# Patient Record
Sex: Male | Born: 1985
Health system: Southern US, Community
[De-identification: ages and names within clinical notes are randomized; demographics above are authoritative.]

## PROBLEM LIST (undated history)

## (undated) DIAGNOSIS — E119 Type 2 diabetes mellitus without complications: Secondary | ICD-10-CM

---

## 2013-03-10 ENCOUNTER — Emergency Department (HOSPITAL_COMMUNITY)
Admission: EM | Admit: 2013-03-10 | Discharge: 2013-03-10 | Disposition: A | Discharge status: Home / Self Care | Payer: 59 | Source: Home / Self Care | Attending: Family Medicine | Admitting: Family Medicine

## 2013-03-10 ENCOUNTER — Encounter (HOSPITAL_COMMUNITY): Payer: Self-pay | Admitting: Emergency Medicine

## 2013-03-10 DIAGNOSIS — J069 Acute upper respiratory infection, unspecified: Secondary | ICD-10-CM

## 2013-03-10 HISTORY — DX: Type 2 diabetes mellitus without complications: E11.9

## 2013-03-10 MED ORDER — AZITHROMYCIN 250 MG PO TABS: ORAL_TABLET | ORAL | Status: DC

## 2013-03-10 MED ORDER — HYDROCOD POLST-CHLORPHEN POLST 10-8 MG/5ML PO LQCR: 5.0000 mL | Freq: Two times a day (BID) | ORAL | Status: DC | PRN

## 2013-03-10 NOTE — ED Notes (Signed)
Pt  Reports  He   Had  Symptoms  Of a  sorettroat  And  r  Earache      Which  Developed  Into        A  Non   Productive      Cough        For  The  Most  Part

## 2013-03-10 NOTE — ED Provider Notes (Signed)
CSN: [id number]     Arrival date & time 03/10/13  2956 History   First MD Initiated Contact with Patient 03/10/13 [phone]     Chief Complaint  Patient presents with  . Cough   (Consider location/radiation/quality/duration/timing/severity/associated sxs/prior Treatment) Patient is a 27 y.o. male presenting with cough. The history is provided by the patient.  Cough Cough characteristics:  Non-productive and dry Severity:  Mild Onset quality:  Gradual Duration:  8 days Timing:  Constant Progression:  Unchanged Chronicity:  New Smoker: no   Context: upper respiratory infection   Relieved by:  None tried Worsened by:  Nothing tried Ineffective treatments:  Cough suppressants Associated symptoms: no fever, no rhinorrhea and no wheezing     Past Medical History  Diagnosis Date  . Diabetes mellitus without complication    History reviewed. No pertinent past surgical history. No family history on file. History  Substance Use Topics  . Smoking status: Never Smoker   . Smokeless tobacco: Not on file  . Alcohol Use: No    Review of Systems  Constitutional: Negative.  Negative for fever.  HENT: Negative for congestion, postnasal drip and rhinorrhea.   Respiratory: Positive for cough. Negative for wheezing.     Allergies  Review of patient's allergies indicates no known allergies.  Home Medications   Current Outpatient Rx  Name  Route  Sig  Dispense  Refill  . azithromycin (ZITHROMAX Z-PAK) 250 MG tablet      Take as directed on pack   6 each   0   . chlorpheniramine-HYDROcodone (TUSSIONEX PENNKINETIC ER) 10-8 MG/5ML LQCR   Oral   Take 5 mLs by mouth every 12 (twelve) hours as needed for cough.   115 mL   1    BP 154/91  Pulse 106  Temp(Src) 98.3 F (36.8 C) (Oral)  Resp 20  SpO2 97% Physical Exam  Nursing note and vitals reviewed. Constitutional: He is oriented to person, place, and time. He appears well-developed and well-nourished.  HENT:  Head:  Normocephalic.  Right Ear: External ear normal.  Left Ear: External ear normal.  Mouth/Throat: Oropharynx is clear and moist.  Eyes: Conjunctivae are normal. Pupils are equal, round, and reactive to light.  Neck: Normal range of motion. Neck supple.  Cardiovascular: Normal rate, regular rhythm and normal heart sounds.   Pulmonary/Chest: Effort normal and breath sounds normal. He has no wheezes. He has no rales.  Lymphadenopathy:    He has no cervical adenopathy.  Neurological: He is alert and oriented to person, place, and time.  Skin: Skin is warm and dry.    ED Course  Procedures (including critical care time) Labs Review Labs Reviewed - No data to display Imaging Review No results found.  EKG Interpretation    Date/Time:    Ventricular Rate:    PR Interval:    QRS Duration:   QT Interval:    QTC Calculation:   R Axis:     Text Interpretation:              MDM      Linna Hoff, MD 03/10/13 1007

## 2013-05-14 ENCOUNTER — Encounter: Payer: 59 | Attending: Endocrinology | Admitting: *Deleted

## 2013-05-14 ENCOUNTER — Encounter: Payer: Self-pay | Admitting: *Deleted

## 2013-05-14 DIAGNOSIS — Z713 Dietary counseling and surveillance: Secondary | ICD-10-CM | POA: Insufficient documentation

## 2013-05-14 DIAGNOSIS — E119 Type 2 diabetes mellitus without complications: Secondary | ICD-10-CM | POA: Insufficient documentation

## 2013-05-14 DIAGNOSIS — IMO0002 Reserved for concepts with insufficient information to code with codable children: Secondary | ICD-10-CM

## 2013-05-14 DIAGNOSIS — E1065 Type 1 diabetes mellitus with hyperglycemia: Secondary | ICD-10-CM

## 2013-05-14 NOTE — Progress Notes (Signed)
Appt start time: 1000 end time:  1030.  Assessment:  Patient was seen on  05/14/13 for individual diabetes education, referred by [facility].Marland Kitchen Lives with wife and 2 children. 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 SMBG 3-4 times a day. Likes basketball and tennis. Used to exercise more often before he had kids. Works with Oretha Ellis, RPh as his diabetes educator. Feels his carb counting ability is better than average. He states his usual A1c is in the7 - 8 % range but when he started working night shift it increased to 10.3%. Since that time he has met with his MD and updated his pump settings to include a basal pattern for night shift and his A1c has already improved to 8.7%.   Current HbA1c: 10.3%, states he has seen his MD since last test and it is down to 8.7% with improved pump settings.   Preferred Learning Style:   No preference indicated   Learning Readiness:   Ready  Change in progress  MEDICATIONS: see list. Novolog in pump  DIETARY INTAKE:  24-hr recall:  B ( AM): cereal with 2% milk OR eggs, 5 bacon, 2 toast OR oatmeal, milk to drink Snk ( AM): no  L ( PM): sandwich OR salad OR leftovers Snk ( PM): occasionally graham crackers and PNB D ( PM): large serving of meat, a starch, a vegetable, diet soda Snk ( PM): occasionally more graham cracker and PNB, not usually though Beverages: milk, diet soda, water, hot tea when working nights  Usual physical activity: goes to gym, plays basket ball  Estimated energy needs: 2100 calories 235 g carbohydrates 158 g protein 58 g fat  Intervention:  Nutrition counseling provided. He appears very knowledgeable of his diabetes management opportunities, has a good exercise routine and expressed good understanding of carb counting. He is planning to add personal CGM to his diabetes management as soon as he can make the appointment with his diabetes educator, Oretha Ellis, RPh. I reviewed carb counting from a  food group standpoint to assist him in making decisions in restaurants. Also reminded him of the importance of getting his eyes examined yearly.  Teaching Method Utilized:Auditory  Handouts given during visit include:  Carb Counting handout  Barriers to learning/adherence to lifestyle change: none  Diabetes self-care support plan:   [place] Type 1 support group available  Demonstrated degree of understanding via:  Teach Back   Monitoring/Evaluation:  Dietary intake, exercise, use of insulin pump and CGM, and body weight prn.

## 2013-09-19 ENCOUNTER — Ambulatory Visit (INDEPENDENT_AMBULATORY_CARE_PROVIDER_SITE_OTHER): Payer: Self-pay | Admitting: Family Medicine

## 2013-09-19 DIAGNOSIS — E119 Type 2 diabetes mellitus without complications: Secondary | ICD-10-CM | POA: Insufficient documentation

## 2013-09-19 DIAGNOSIS — E109 Type 1 diabetes mellitus without complications: Secondary | ICD-10-CM

## 2013-09-19 NOTE — Progress Notes (Signed)
Patient presents today for annual pharmacy visit for type 1 diabetes follow-up as part of the employer-sponsored Link to Wellness program. He has been followed by Dudley MajorMelissa Sandlin as CM, but will now be transitioned to myself as CM for quarterly follow-up. Current diabetes regimen includes Humalog via Medtronic pump. Patient does not currently take ASA, ACEi, or statin as these are not yet indicated. Most recent MD follow-up was June 2015. Patient has a pending appt for 3-4 mo follow-up in Sept 2015. Levothyroxine was added at recent MD appt in June. No issues with new med. No major health changes at this time.  Diabetes Assessment: Type of Diabetes: Type 1; Year of diagnosis 2005; Sees Diabetes provider 4 or more times per year; MD managing Diabetes Dr.South; checks blood glucose 3-4 times a day;  target A1c <7.0 %; takes medications as prescribed; checks feet daily; does not take an aspirin a day; uses glucometer Freestyle Lite; Diabetes Education 05/14/13 1:1 RD at [facility]; hypoglycemia frequency rare; Highest CBG 250; Lowest CBG 140; A1c 8.4 Other Diabetes History: Current med regimen includes Humalog via pump. Patient is able to adjust his own basal rates and settings, although Dr. Evlyn KannerSouth does this some as well. He also has seen Edison Paceathey Miller, CDE in the past but does not see her regularly. Patient does maintain good medication compliance. Patient did not bring meter today but is currently testing 3-4 times per day. He uses CGM sensor at times and adjusts testing based on whether or not he is wearing sensor. Glucose monitoring occurs fasting, before supper, and before bed during off weeks. When working night shift testing occurs after waking from sleep, around midnight, and around 6 am. Also tests if/when symptomatic. Hypoglycemia frequency is rare and easily corrected. Per pt report, glucose generally runs 140-250s. He notes that glucose has become more difficult to control  after starting night shift. A1c has always been <8.0 in the past, and after starting night shift A1c increased to >10, it has now decreased to 8.4, but goal remains <7.0 and patient is hopeful it will continue to imrpove. Patient denies signs and symptoms of neuropathy including numbness/tingling/burning and symptoms of foot infection. Patient is not due for eye exam, yearly exam not indicated at this time.  Lifestyle Factors: Diet - no major changes, patient reports he could use improvement in diet, but feels diet is overall under good control. He is familiar with carb counting and does so prior to meals for accurate bolus dosing. He uses bolus wizard to calculate doses.  Exercise - Plays basketball once weekly and walks at least 3 times weekly. Pt works night shift 7 on, 7 off. He walks nearly every day on off week and usually 3 times during work week.   Assessment: Patient presents today for pharmacy consult and with improved A1c. He has been a type 1 diabetic for approximately 10 years and has had recent worsening of DM control after starting night shift. DM is improving now and patient is learning to manage better while on night shift. He does well with diabetes and exercise, but will work to maintain and further improve these lifestyle areas. He will follow-up in 3 months.  Plan: 1) Continue making healthy dietary choices 2) Continue exercising as much as possible, goal of 150 min per week 3) Continue testing regularly 4) Follow-up in 3 months on Wednesday Oct 7th @ 8:30 am (may reschedule if needed depending on work schedule)

## 2013-11-08 ENCOUNTER — Encounter: Payer: Self-pay | Admitting: Family Medicine

## 2013-11-08 NOTE — Progress Notes (Signed)
Patient ID: Lee West, male   DOB: 1985/07/07, 28 y.o.   MRN: [id number] Reviewed: Agree with the documentation and management by my [facility] Pharmacologist.

## 2013-12-19 ENCOUNTER — Ambulatory Visit (INDEPENDENT_AMBULATORY_CARE_PROVIDER_SITE_OTHER): Payer: Self-pay | Admitting: Family Medicine

## 2013-12-19 VITALS — Wt 204.0 lb

## 2013-12-19 DIAGNOSIS — E109 Type 1 diabetes mellitus without complications: Secondary | ICD-10-CM

## 2013-12-19 NOTE — Progress Notes (Signed)
Patient presents today for 3 month follow-up for type 1 diabetes as part of the employer-sponsored Link to Wellness program. Current diabetes regimen includes Humalog via Medtronic pump. Patient does not currently take ASA, ACEi, or statin as these are not yet indicated. Most recent MD follow-up was Sept 2015. Patient has a pending apt with Edison Paceathey Miller, CDE in November and with Dr. Evlyn KannerSouth, endo in Feb. No med changes or major health changes at this time. A1c at most recent MD apt was 8.7.  Diabetes Assessment: Type of Diabetes: Type 1; Year of diagnosis 2005; Sees Diabetes provider 4 or more times per year; MD managing Diabetes Dr.South; checks blood glucose 3-4 times a day; What is target A1c? 7.0 %; takes medications as prescribed; checks feet daily; does not take an aspirin a day; What are the signs and symptoms of hypoglycemia? Shaky; What are the signs of hyperglycemia? High blood glucose; uses glucometer Freestyle Lite; Diabetes Education 05/14/13 1:1 RD at [facility]; Lowest CBG 140; hypoglycemia frequency rare; 7 day CBG average 177; 14 day CBG average 187; 30 day CBG average 215; Highest CBG 397; A1c 8.7 Sept 2015 Other Diabetes History: Current med regimen includes Humalog via pump. Patient is able to adjust his own basal rates and settings, although Dr. Evlyn KannerSouth does this some as well. He also sees Kinder Morgan EnergyCathey Miller, CDE on occasion. Patient does maintain good medication compliance. Patient did bring meter today and is currently testing 1-3 times per day. He uses CGM sensor and adjusts testing based on whether or not he is wearing sensor. While wearing sensor he must test at least every 12 hours to calibrate. Glucose monitoring occurs fasting, before supper, and before bed during off weeks. When working night shift testing occurs after waking from sleep, around midnight, and around 6 am. Also tests if/when symptomatic. Hypoglycemia frequency is rare and easily corrected. A1c has  increased from 8.4 to 8.7. Patient denies signs and symptoms of neuropathy including numbness/tingling/burning and symptoms of foot infection. Patient is not due for eye exam, yearly exam not indicated at this time.  Lifestyle Factors: Diet - no major changes, diet is overall under good control. He is familiar with carb counting and does so prior to meals for accurate bolus dosing. He uses bolus wizard to calculate doses.  Exercise - Pt works night shift 7 on, 7 off. He walks nearly every day on off week and usually 3 times during work week. Joined [employer] for winter months and will attempt to exercise here at least twice weekly.  Assessment: Patient presents today for follow-up and deteriorated A1c. He has been a type 1 diabetic for approximately 10 years and has had recent worsening of DM control after starting night shift. DM is improving now and patient is learning to manage better while on night shift. He does well with diabetes and exercise, but will work to maintain and further improve these lifestyle areas. He will follow-up in 3 months.  Plan: 1) Continue making healthy dietary choices 2) Continue exercising as much as possible, goal of 150 min per week, attempt to use [employer] at least twice per week 3) Continue testing regularly 4) Follow-up in 3 months on Tuesday Jan 6th @ 8:30 am

## 2014-01-29 ENCOUNTER — Encounter: Payer: Self-pay | Admitting: Family Medicine

## 2014-01-29 NOTE — Progress Notes (Signed)
Patient ID: Lee West, male   DOB: 12-31-1985, 28 y.o.   MRN: [id number] Reviewed: Agree with the documentation and management of our [facility] Pharmacologist.

## 2014-03-20 ENCOUNTER — Ambulatory Visit (INDEPENDENT_AMBULATORY_CARE_PROVIDER_SITE_OTHER): Payer: Self-pay | Admitting: Family Medicine

## 2014-03-20 VITALS — BP 137/84 | Wt 207.0 lb

## 2014-03-20 DIAGNOSIS — E109 Type 1 diabetes mellitus without complications: Secondary | ICD-10-CM

## 2014-03-20 NOTE — Progress Notes (Signed)
Patient presents today for 3 month follow-up for type 1 diabetes as part of the employer-sponsored Link to Wellness program. Current diabetes regimen includes Humalog via Medtronic pump. Patient does not currently take ASA, ACEi, or statin as these are not yet indicated. Most recent MD follow-up was Nov 2015, with Lee West, Lee West. Patient has a pending apt with Dr. Evlyn West, endo in Feb. No med changes or major health changes at this time. POC A1c today was 9.2 (prev 8.7 in Sept).  Diabetes Assessment: Type of Diabetes: Type 1; Year of diagnosis 2005; Sees Diabetes provider 4 or more times per year; MD managing Diabetes Dr.South; checks blood glucose 3-4 times a day; What is target A1c? 7.0 %; takes medications as prescribed; checks feet daily; does not take an aspirin a day; High blood glucose; uses glucometer Freestyle Lite; hypoglycemia frequency rare; 7 day CBG average 186; 14 day CBG average 214; 30 day CBG average 213; Highest CBG 350; Lowest CBG 71 Other Diabetes History: Current med regimen includes Humalog via pump. Patient is able to adjust his own basal rates and settings, although Dr. Evlyn West does this some as well. He also sees Kinder Morgan EnergyCathey West, Lee West on occasion. He most recently saw Lee West in November and minor basal rate adjustments were made, but patient will adjust further based on A1c today. Patient does maintain good medication compliance. Patient did bring meter today and is currently testing 1-3 times per day. He uses CGM sensor and adjusts testing based on whether or not he is wearing sensor. While wearing sensor he must test at least every 12 hours to calibrate. Glucose monitoring occurs fasting, before supper, and before bed during off weeks. When working night shift testing occurs after waking from sleep, around midnight, and around 6 am. Also tests if/when symptomatic. Hypoglycemia frequency is rare and easily corrected. A1c has increased from 8.7 to 9.2. Patient denies signs and symptoms of  neuropathy including numbness/tingling/burning and symptoms of foot infection. Patient is due for yearly eye exam in 2016.  Lifestyle Factors: Diet - no major changes, diet is overall under good control. He is familiar with carb counting and does so prior to meals for accurate bolus dosing. He uses bolus wizard to calculate doses.  Exercise - Pt works night shift 7 on, 7 off. He has joined [employer] for winter months and attempts to exercise here twice weekly, but admits that during work weeks he usually only exercises once weekly.   Plan: 1) Continue making healthy dietary choices 2) Continue exercising as much as possible, goal of 150 min per week, attempt to use [employer] at least twice per week 3) Continue testing regularly 4) Follow-up in 3 months on Wednesday April 6th @ 8:30 am

## 2014-04-23 ENCOUNTER — Encounter: Payer: Self-pay | Admitting: Family Medicine

## 2014-04-23 NOTE — Progress Notes (Signed)
Patient ID: Lee West, male   DOB: 13-May-1985, 29 y.o.   MRN: [id number] Reviewed: Agree with documentation and management of our [facility] pharmacologist.

## 2014-06-19 ENCOUNTER — Ambulatory Visit (INDEPENDENT_AMBULATORY_CARE_PROVIDER_SITE_OTHER): Payer: Self-pay | Admitting: Family Medicine

## 2014-06-19 ENCOUNTER — Ambulatory Visit: Payer: Self-pay | Admitting: Pharmacist

## 2014-06-19 VITALS — BP 136/93 | Ht 71.0 in | Wt 205.0 lb

## 2014-06-19 DIAGNOSIS — E109 Type 1 diabetes mellitus without complications: Secondary | ICD-10-CM

## 2014-06-19 NOTE — Progress Notes (Signed)
Subjective:  Patient presents today for 3 month diabetes follow-up as part of the employer-sponsored Link to Wellness program.  Current diabetes regimen includes Humalog via pump.  ASA, ACE Inhibitor and statin are not yet indicated.  Most recent MD follow-up was Feb 2016.  Patient has a pending appt for 4 month follow-up.  No med changes or major health changes at this time.   Assessment/Plan:  Patient is a 29 yo male with DM type 1. Most recent A1C was 9.0 % which is exceeding goal of less than 7%. Weight is stable.  Pt is tolerating insulin regimen well, and patient adjusts basal rates as needed.  Most recently he adjusts the 11pm-7am rate during night shift hours to better control glucose during work days.  He did bring meter today and is testing once daily and as needed.  He wears CGM daily and uses this as his primary source of glucose monitoring.  Hypoglycemia occurs occasionally, and patient is aware of how to recognize symptoms and properly correct.  Patient has not had a recent eye exam but will schedule one soon.  MD has recommended an exam every two years at this time.  Patient sees a dentist every 6 months for routine checkup.    Lifestyle:  Physical Activity- Patient is walking 1 mile three times per week, sometimes more.  He has been doing this over the past month due to warmer weather.  Patient reports noticing improvements in glucose during times of more regular exercise.   Diet - Patient attempts to maintain a healthy diet and is compliant with carb counting.  He reports portion sizes being somewhat uncontrolled on occasion and wants to work on this for next visit.     Goals for Next Visit:  1)  Maintain regular exercise, at least three times per week, goal of 150 minutes per week 2)  Maintain healthy dietary choices, attempt to better manage portion control in areas in need 3)  Continue testing adequately and maintaining compliance with medications 4)  Follow-up in 3 months on  Tuesday June 28th @ 8:30 am   Lee West, PharmD Link to Wellness Diabetes Care Coordinator [facility]

## 2014-06-19 NOTE — Patient Instructions (Addendum)
1)  Maintain regular exercise, at least three times per week, goal of 150 minutes per week 2)  Maintain healthy dietary choices, attempt to better manage portion control in areas in need 3)  Continue testing adequately and maintaining compliance with medications 4)  Follow-up in 3 months on Tuesday June 28th @ 8:30 am

## 2014-06-25 NOTE — Progress Notes (Signed)
Patient ID: Lee West, male   DOB: 03/28/1985, 29 y.o.   MRN: [id number] ATTENDING PHYSICIAN NOTE: I have reviewed the chart and agree with the plan as detailed above. Denny LevySara Connelly Spruell MD Pager [phone]

## 2014-09-10 ENCOUNTER — Ambulatory Visit: Payer: 59 | Admitting: Pharmacist

## 2014-09-10 ENCOUNTER — Ambulatory Visit (INDEPENDENT_AMBULATORY_CARE_PROVIDER_SITE_OTHER): Payer: Self-pay | Admitting: Family Medicine

## 2014-09-10 VITALS — BP 124/82 | Ht 71.0 in | Wt 205.0 lb

## 2014-09-10 DIAGNOSIS — E109 Type 1 diabetes mellitus without complications: Secondary | ICD-10-CM

## 2014-09-10 NOTE — Progress Notes (Signed)
Subjective:  Patient presents today for 3 month diabetes follow-up as part of the employer-sponsored Link to Wellness program.  Current diabetes regimen includes Humalog via pump.  ASA, ACE Inhibitor and statin are not yet indicated.  Most recent MD follow-up was June 2016.  Patient has a pending appt for 4 month follow-up in Oct.  Levothyroxine dose adjusted, otherwise, no med changes or major health changes at this time.   Assessment/Plan:  Patient is a 29 yo male with DM type 1. Most recent A1C was 9.2 % (prev 9.0%) which is exceeding goal of less than 7%. Weight is stable.  Pt is tolerating insulin regimen well, and patient adjusts basal rates as needed.  Most recently he and Dr. Evlyn KannerSouth adjusted both 11pm-7am rate and the 7am-10pm rates overall increasing total daily insulin.  He did bring meter today and is testing once daily and as needed.  He wears CGM daily and uses this as his primary source of glucose monitoring.  Hypoglycemia occurs occasionally, and patient is aware of how to recognize symptoms and properly correct.  Patient has not had a recent eye exam but will schedule one within the next year as recommended by Dr. Evlyn KannerSouth.  MD has recommended an exam every two years at this time.  Patient sees a dentist every 6 months for routine checkup.    Glucose Averages  7d - 175 14d - 194 30d - 175  Lifestyle:  Physical Activity- Patient continues walking 1 mile 3-5 times per week, especially on off weeks (works night shift every other week).  He will continue to exercise regularly.  Diet - Patient attempts to maintain a healthy diet and is compliant with carb counting.  He reports portion sizes continue to be somewhat uncontrolled and wants to continue to work on this for next visit.     Goals for Next Visit:  1)  Continue testing regularly 2)  Continue exercising at least three days per week 3)  Continue to maintain a healthy diet limiting portion sizes and sweets 4)  Schedule an eye  exam within the next year 5)  Follow-up in 3 months on Tuesday Sept 20th @ 8:30 am   Orlin HildingElizabeth Joshva Labreck, PharmD Link to Wellness Diabetes Care Coordinator [facility]

## 2014-09-10 NOTE — Patient Instructions (Addendum)
1)  Continue testing regularly 2)  Continue exercising at least three days per week 3)  Continue to maintain a healthy diet limiting portion sizes and sweets 4)  Schedule an eye exam within the next year 5)  Follow-up in 3 months on Tuesday Sept 20th @ 8:30 am

## 2014-09-17 NOTE — Progress Notes (Signed)
Patient ID: Stevphen RochesterJames L Hedberg, male   DOB: 04-14-1985, 29 y.o.   MRN: [id number] ATTENDING PHYSICIAN NOTE: I have reviewed the chart and agree with the plan as detailed above. Denny LevySara Shaheem Pichon MD Pager [phone]

## 2014-12-03 ENCOUNTER — Ambulatory Visit (INDEPENDENT_AMBULATORY_CARE_PROVIDER_SITE_OTHER): Payer: Self-pay | Admitting: Family Medicine

## 2014-12-03 ENCOUNTER — Encounter: Payer: Self-pay | Admitting: Pharmacist

## 2014-12-03 ENCOUNTER — Ambulatory Visit: Payer: 59 | Admitting: Pharmacist

## 2014-12-03 VITALS — BP 114/80 | Wt 205.0 lb

## 2014-12-03 DIAGNOSIS — E109 Type 1 diabetes mellitus without complications: Secondary | ICD-10-CM

## 2014-12-03 NOTE — Progress Notes (Signed)
Subjective:  Patient presents today for 3 month diabetes follow-up as part of the employer-sponsored Link to Wellness program.  Current diabetes regimen includes Humalog via pump.  ASA, ACE Inhibitor and statin are not yet indicated.  Most recent MD follow-up was June 2016.  Patient has a pending appt for 4 month follow-up in Oct.  Levothyroxine dose adjusted, otherwise, no med changes or major health changes at this time.    Assessment/Plan:  Patient is a 29 yo male with DM type 1. Most recent A1C was 9.2 % (prev 9.0%) which is exceeding goal of less than 7%. Weight is stable.  Pt is tolerating insulin regimen well, and patient adjusts basal rates as needed.  Most recently he has started adjusting daytime off-work rates due to hyperglycemia and will begin to adjust night-time rates soon. He did bring meter today and is testing 1-2 times daily and as needed.  He wears CGM daily and uses this as his primary source of glucose monitoring.  Patient was sick several weeks ago and since this time glucose has been uncontrolled running consistently >200.  Patient did have one reading recently of 65 after making an adjustment to basal rates.  Hypoglycemia occurs occasionally, and patient is aware of how to recognize symptoms and properly correct.  Patient has not had a recent eye exam but will schedule one within the next year as recommended by Dr. Evlyn Kanner.  MD has recommended an exam every two years at this time.  Patient sees a dentist every 6 months for routine checkup.     Glucose Averages  7d - 175, now 230 14d - 194, now 241 30d - 175, now 241   Lifestyle:  Physical Activity- Patient continues exercising 2-3 times per week, especially on off weeks (works night shift every other week).  He is now exercising at the [facility] for 1 hour each time.   Diet - Patient attempts to maintain a healthy diet and is compliant with carb counting.  He has worked on better portion control recently and is limiting his  portions when working night shift.  He eats fast food approximately three times per week.    Goals for Next Visit:  1)  Continue testing regularly 2)  Continue exercising at least three days per week 3)  Continue to maintain a healthy diet and work on maintaining portion control 4)  Schedule an eye exam  5)  Follow-up in 3 months on Wednesday December 14th @ 8:30 am   Orlin Hilding, PharmD Link to Wellness Diabetes Care Coordinator [facility]

## 2014-12-03 NOTE — Patient Instructions (Signed)
1)  Continue testing regularly 2)  Continue exercising at least three days per week 3)  Continue to maintain a healthy diet and work on maintaining portion control 4)  Schedule an eye exam  5)  Follow-up in 3 months on Wednesday December 14th @ 8:30 am   Great to see you today!

## 2014-12-30 NOTE — Progress Notes (Signed)
I reviewed this chart and agree with the pharmacist's recommendation.  

## 2015-02-26 ENCOUNTER — Encounter: Payer: Self-pay | Admitting: Pharmacist

## 2015-02-26 ENCOUNTER — Ambulatory Visit (INDEPENDENT_AMBULATORY_CARE_PROVIDER_SITE_OTHER): Payer: Self-pay | Admitting: Family Medicine

## 2015-02-26 VITALS — BP 140/84 | HR 71 | Wt 213.0 lb

## 2015-02-26 DIAGNOSIS — E109 Type 1 diabetes mellitus without complications: Secondary | ICD-10-CM

## 2015-02-26 NOTE — Patient Instructions (Signed)
1)  Continue testing regularly 2)  Continue exercising at least 2-3 days per week 3)  Continue to maintain a healthy diet and work on maintaining portion control, especially during the holidays 4)  Follow-up in 6 months on Tuesday July 11th @ 8:30 am  Great to see you today!

## 2015-02-26 NOTE — Progress Notes (Signed)
Subjective:  Patient presents today for 3 month diabetes follow-up as part of the employer-sponsored Link to Wellness program.  Current diabetes regimen includes Humalog via pump.  ASA, ACE Inhibitor and statin are not yet indicated.  Most recent MD follow-up was Oct 2016.  Patient has a pending appt for follow-up in March.  No med changes or major health changes at this time.     Assessment/Plan:  Patient is a 29 yo male with DM type 1. Most recent A1C was 8.5% (prev 9.2 %) which is exceeding goal of less than 7%. Weight has increased by 8 lb since last appt.  Pt is tolerating insulin regimen well, and patient adjusts basal rates as needed.  Most recently he adjusted daytime off-work rates prior to last MD appt, no recent adjustments. He did bring meter today and is testing 1-2 times daily and as needed.  He wears CGM daily and uses this as his primary source of glucose monitoring.  Hypoglycemia occurs occasionally, and patient is aware of how to recognize symptoms and properly correct.  Patient is scheduled for an eye exam this month.  MD has recommended an exam every two years at this time.  Patient sees a dentist every 6 months for routine checkup.  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  [facility].   Diet - Patient attempts to maintain a healthy diet and is compliant with carb counting.  He will attempt to limit portion sizes during the holidays and will be diligent in making healthy choices and carb counting.      Goals for Next Visit:  1)  Continue testing regularly 2)  Continue exercising at least 2-3 days per week 3)  Continue to maintain a healthy diet and work on maintaining portion control, especially during  the holidays 4)  Follow-up in 6 months on Tuesday July 11th @ 8:30 am  Great to see you today!  Orlin HildingElizabeth Bekki Tavenner, PharmD Link to Wellness Diabetes Care Coordinator [facility]

## 2015-03-11 NOTE — Progress Notes (Signed)
I have reviewed this pharmacist's note and agree  

## 2015-03-28 MED FILL — FREESTYLE LITE TEST STRIP: 75 days supply | Qty: 300 | Fill #2

## 2015-03-28 MED FILL — NovoLOG 100 UNIT/ML SOLN: 100 | 90 days supply | Qty: 80 | Fill #0

## 2015-03-28 MED FILL — LEVOTHYROXINE 125 MCG TAB: 125 | 90 days supply | Qty: 90 | Fill #1

## 2015-05-21 DIAGNOSIS — E038 Other specified hypothyroidism: Secondary | ICD-10-CM | POA: Diagnosis not present

## 2015-05-21 DIAGNOSIS — Z6829 Body mass index (BMI) 29.0-29.9, adult: Secondary | ICD-10-CM | POA: Diagnosis not present

## 2015-05-21 DIAGNOSIS — E1065 Type 1 diabetes mellitus with hyperglycemia: Secondary | ICD-10-CM | POA: Diagnosis not present

## 2015-06-09 DIAGNOSIS — E109 Type 1 diabetes mellitus without complications: Secondary | ICD-10-CM | POA: Diagnosis not present

## 2015-07-02 MED FILL — FREESTYLE LITE TEST STRIP: 25 days supply | Qty: 100 | Fill #3

## 2015-07-02 MED FILL — NovoLOG 100 UNIT/ML SOLN: 100 | 90 days supply | Qty: 80 | Fill #1

## 2015-09-04 MED FILL — LEVOTHYROXINE 125 MCG TAB: 125 | 90 days supply | Qty: 90 | Fill #0

## 2015-09-08 DIAGNOSIS — E109 Type 1 diabetes mellitus without complications: Secondary | ICD-10-CM | POA: Diagnosis not present

## 2015-09-11 DIAGNOSIS — E109 Type 1 diabetes mellitus without complications: Secondary | ICD-10-CM | POA: Diagnosis not present

## 2015-09-23 ENCOUNTER — Ambulatory Visit: Payer: Self-pay | Admitting: Pharmacist

## 2015-10-07 DIAGNOSIS — Z6829 Body mass index (BMI) 29.0-29.9, adult: Secondary | ICD-10-CM | POA: Diagnosis not present

## 2015-10-07 DIAGNOSIS — E038 Other specified hypothyroidism: Secondary | ICD-10-CM | POA: Diagnosis not present

## 2015-10-07 DIAGNOSIS — E1065 Type 1 diabetes mellitus with hyperglycemia: Secondary | ICD-10-CM | POA: Diagnosis not present

## 2015-10-23 ENCOUNTER — Other Ambulatory Visit: Payer: Self-pay | Admitting: Pharmacist

## 2015-10-23 ENCOUNTER — Encounter: Payer: Self-pay | Admitting: Pharmacist

## 2015-10-23 VITALS — BP 116/82 | Wt 208.0 lb

## 2015-10-23 DIAGNOSIS — E109 Type 1 diabetes mellitus without complications: Secondary | ICD-10-CM

## 2015-10-23 NOTE — Patient Outreach (Signed)
[facility]) Care Management  10/23/2015  Stevphen RochesterJames L Kluth 01-19-1986 [id number]   Subjective:  Patient presents today for 3 month diabetes follow-up as part of the employer-sponsored Link to Wellness program. Current diabetes regimen includes Humalog via pump. ASA, ACE Inhibitor and statin are not yet indicated. Most recent MD follow-up was July 2017. Patient has a pending appt for follow-up in December 2017. No med changes or major health changes at this time.     Assessment/Plan:  Patient is a 30 yo male with DM type 1. Most recent A1C was 8.5% (prev 8.9 % March) which is exceeding goal of less than 7%. Weight has decreased by 6 lb since last appt.  Pt is tolerating insulin regimen well, and patient adjusts basal rates as needed.  Most recently he adjusted daytime off-work rates, specifically early AM.  Also, he is on a break from CGM at this time, but plans to resume use soon and at this time will increase overall basal rates by 5%.  He did bring meter today and is testing 2-3 times daily and as needed, more often when not wearing CGM.  He wears CGM daily for 5-6 weeks then takes a break for 1-2 weeks as it is difficult to find enough sites for continuous CGM use.  Hypoglycemia is rare at this time, and patient is aware of how to recognize symptoms and properly correct.  Patient is up to date on eye exam, last exam in December.  MD has recommended an exam every two years at this time.  Patient sees a dentist every 6 months for routine checkup.    Average insulin use per day:  70-80 units daily (will soon be increasing by 5% overall)  Home meter (fasting readings mostly) 7d - 183 14d - 184 30d - 194 Work meter  7d - 175 14d - 171 30d - 185  Lifestyle:  Physical Activity- Patient continues exercising specifically on off weeks.  Exercises more often in summer months due to warm weather.  This summer he has been biking outdoors at least 5 days per week.  He is  not using [employer] membership at this time, but will resume once weather is cold.    Diet - Patient attempts to maintain a healthy diet and is compliant with carb counting.  Of note, he has recently made an effort to reduce diet soda and is now drinking only 1-2 times per week vs daily.    Food recall: Breakfast - english muffin blueberry, PB, sometimes banana Snacks - none Lunch - sandwich makes at home Snacks - fig bars or PB crackers Supper - eat at home and out 50/50 - chicken, hamburger, pork Eat out chickfila or [place] Beverages - water, 1/2 sweet tea, juice box No sweets on regular basis   Goals for Next Visit:  1)  Continue testing regularly 2)  Continue exercising at least 3 days per week, preferrably maintain 5 days per week 3)  Continue to maintain a healthy diet and work on maintaining portion control 4)  Follow-up in 3 months on Thursday November 16 at 9:30 am  Great to see you today!

## 2015-11-07 MED FILL — NovoLOG 100 UNIT/ML SOLN: 100 | 90 days supply | Qty: 80 | Fill #2

## 2015-11-07 MED FILL — FREESTYLE LITE TEST STRIP: 75 days supply | Qty: 300 | Fill #0

## 2015-12-17 DIAGNOSIS — E109 Type 1 diabetes mellitus without complications: Secondary | ICD-10-CM | POA: Diagnosis not present

## 2016-01-02 DIAGNOSIS — E109 Type 1 diabetes mellitus without complications: Secondary | ICD-10-CM | POA: Diagnosis not present

## 2016-01-16 MED FILL — LEVOTHYROXINE 125 MCG TAB: 125 | 90 days supply | Qty: 90 | Fill #1

## 2016-01-29 ENCOUNTER — Encounter: Payer: Self-pay | Admitting: Pharmacist

## 2016-01-29 ENCOUNTER — Other Ambulatory Visit: Payer: Self-pay | Admitting: Pharmacist

## 2016-01-29 VITALS — BP 122/86 | Wt 208.0 lb

## 2016-01-29 DIAGNOSIS — E108 Type 1 diabetes mellitus with unspecified complications: Secondary | ICD-10-CM

## 2016-01-29 NOTE — Patient Outreach (Signed)
  [facility]) Care Management  01/29/2016  Lee West 1985/04/10 [id number]   Subjective:  Patient presents today for 3 month diabetes follow-up as part of the employer-sponsored Link to Wellness program. Current diabetes regimen includes Novolog via pump. ASA, ACE Inhibitor and statin are not yet indicated. Most recent MD follow-up was July 2017. Patient has a pending appt for follow-up in December 2017. No med changes or major health changes at this time.     Assessment/Plan:  Patient is a 30 yo male with DM type 1. Most recent A1C was 8.5% (prev 8.9 % March) which is exceeding goal of less than 7%. Weight has remained the same since last appt.  Pt is tolerating insulin regimen well, and patient adjusts basal rates as needed.  Most recently he adjusted daytime off-work basal rates, specifically early AM.  Also, he is using CGM at this time, and has used it often over the previous 2-3 months in preparation to see Lee West, CDE and Dr. Evlyn KannerSouth in December for pump setting adjustments.  He did bring meter today and is testing 2-3 times daily and as needed, more often when not wearing CGM.  He wears CGM daily for 5-6 weeks then takes a break for 1-2 weeks as it is difficult to find enough sites for continuous CGM use.  Hypoglycemia is rare at this time, and patient is aware of how to recognize symptoms and properly correct.  Patient is up to date on eye exam, last exam in December 2016.  MD has recommended an exam every two years at this time.  Patient sees a dentist every 6 months for routine checkup.    Home meter (fasting readings mostly) 7d - 148 14d - 189 30d - 194 Work meter  7d - 179 14d - 168 30d - 189  Lifestyle:  Physical Activity- Patient continues exercising specifically on off weeks.  Exercising less often due to cooler weather of fall season.  He continues biking outdoors at least 3 days per week (prev 5 d/wk during warmer months).  He is  not using [employer] membership at this time, but will consider resuming since he needs options for exercising indoors.    Diet - Patient attempts to maintain a healthy diet and is compliant with carb counting.  Of note, he has recently made an effort to reduce diet soda and is now drinking only 1-2 times per week vs daily.  He recognizes the need to maintain healthy diet and portion control during holidays and will work toward this.   Food recall: Breakfast - english muffin blueberry, PB, sometimes banana Snacks - none Lunch - sandwich makes at home Snacks - fig bars or PB crackers Supper - eat at home and out 50/50 - chicken, hamburger, pork Eat out chickfila or [place] Beverages - water, 1/2 sweet tea, juice box No sweets on regular basis   Goals for Next Visit:  1)  Continue testing regularly 2)  Continue exercising at least 3 days per week, preferrably resume 5 days per week 3)  Continue to maintain a healthy diet and work on maintaining portion control especially during holidays 4)  A [facility] representative will contact you to assign a new Care manager and schedule your next follow-up appointment.   Great to see you today!   Orlin HildingElizabeth Jonathen Rathman, PharmD Link to [facility] [facility]  [phone]

## 2016-02-16 DIAGNOSIS — E038 Other specified hypothyroidism: Secondary | ICD-10-CM | POA: Diagnosis not present

## 2016-02-16 DIAGNOSIS — Z6829 Body mass index (BMI) 29.0-29.9, adult: Secondary | ICD-10-CM | POA: Diagnosis not present

## 2016-02-16 DIAGNOSIS — E1065 Type 1 diabetes mellitus with hyperglycemia: Secondary | ICD-10-CM | POA: Diagnosis not present

## 2016-02-16 MED FILL — FREESTYLE LITE TEST STRIP: 25 days supply | Qty: 100 | Fill #0

## 2016-02-16 MED FILL — NovoLOG 100 UNIT/ML SOLN: 100 | 90 days supply | Qty: 80 | Fill #0

## 2016-03-26 DIAGNOSIS — E109 Type 1 diabetes mellitus without complications: Secondary | ICD-10-CM | POA: Diagnosis not present

## 2016-04-09 DIAGNOSIS — E109 Type 1 diabetes mellitus without complications: Secondary | ICD-10-CM | POA: Diagnosis not present

## 2016-05-10 MED FILL — LEVOTHYROXINE 125 MCG TABLE: 125 | 30 days supply | Qty: 30 | Fill #0

## 2016-06-01 MED FILL — FREESTYLE LITE TEST STRIP: 90 days supply | Qty: 400 | Fill #0

## 2016-06-01 MED FILL — HumaLOG 100 UNIT/ML SOLN: 100 | 88 days supply | Qty: 70 | Fill #0

## 2016-06-18 ENCOUNTER — Other Ambulatory Visit: Payer: Self-pay

## 2016-06-18 VITALS — BP 128/88 | HR 74 | Resp 16 | Ht 71.0 in | Wt 217.4 lb

## 2016-06-18 DIAGNOSIS — E109 Type 1 diabetes mellitus without complications: Secondary | ICD-10-CM

## 2016-06-18 NOTE — Patient Outreach (Signed)
[facility]) Care Management   06/18/2016  Lee West June 21, 1985 [id number]  Lee West is an 31 y.o. male.   Member seen for follow up office visit for Link to Wellness program for self management of Type 1 diabetes  Subjective: Member states that he has been doing good. States that he has been riding his exercise bike 3-4 times a week and playing basketball for 1 hr once a week.  States he thinks he has gained some muscle but has not lost any weight.  States he is to see Lee West on 07/05/16.  States he is using his sensor about 50-70% of the time.  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  States he only has lows about 1-2 times a month with readings of 60-70.   Objective:   Review of Systems  All other systems reviewed and are negative.   Physical Exam Today's Vitals   06/18/16 0856 06/18/16 0900  BP: 128/88   Pulse: 74   Resp: 16   SpO2: 98%   Weight: 217 lb 6.4 oz (98.6 kg)   Height: 1.803 m ( )   PainSc: 0-No pain 0-No pain   Encounter Medications:   Outpatient Encounter Prescriptions as of 06/18/2016  Medication Sig Note  . insulin lispro (HUMALOG) 100 UNIT/ML injection Inject into the skin See admin instructions. Use in insulin pump   . levothyroxine (SYNTHROID, LEVOTHROID) 125 MCG tablet Take 125 mcg by mouth daily before breakfast.   . insulin aspart (NOVOLOG) 100 UNIT/ML injection Inject into the skin once. 05/14/2013: Used in insulin pump   No facility-administered encounter medications on file as of 06/18/2016.     Functional Status:   In your present state of health, do you have any difficulty performing the following activities: 06/18/2016  Hearing? N  Vision? N  Difficulty concentrating or making decisions? N  Walking or climbing stairs? N  Dressing or bathing? N  Doing errands, shopping? N  Some recent data might be hidden    Fall/Depression Screening:    PHQ 2/9 Scores 06/18/2016  PHQ - 2 Score 0     Assessment:   Member seen for follow up office visit for Link to Wellness program for self management of Type 1 diabetes.  Member not meeting diabetes self-management goal of 7% or below with last reading of 8.7%.  Member reports increasing his exercise.  Member working on portion control at meals and making wise choices when eating out.  Member to see Lee West on 07/05/16.  Member over due for eye exam and up to date with dental exams.  Plan:  Plan to exercise 4 times a week 30 minutes and play basketball for 60 minutes. Plan to eat snacks with protein while at work Plan to schedule eye exam Plan to keep 07/05/16 appt with  Lee West office  Plan to see Lee West to Wellness [facility]24/[facility] at [facility]    [facility] CM Care Plan Problem One     Most Recent Value  Care Plan Problem One  Elevated blood sugars related to dx of Type I DM as evidenced by hemoglobin A1C of 8.7%  Role Documenting the Problem One  Care Management Coordinator  Care Plan for Problem One  Active  [facility] Long Term Goal (31-90 days)  Member will lower hemoglobin A1C by 1% in the next 90 days  [facility] Long Term Goal Start Date  06/18/16  Interventions for Problem One Long Term Goal  Reviewed CHO counting and portion sizes, Encouraged to eat more protein with his snacks, Discussed healthy options when eating out,Discussed upgarding to the Medtronic 670G when he is eligible for an upgrade                    Dudley Major RN, [facility] Care Management Coordinator-Link to Wellness [facility] Care Management [phone]

## 2016-06-18 NOTE — Patient Instructions (Signed)
1. Plan to exercise 4 times a week 30 minutes and play basketball for 60 minutes. 2. Plan to eat snacks with protein while at work 3. Plan to schedule eye exam 4. Plan to keep 07/05/16 appt with  [name]  5. Plan to see Link to Wellness on 09/24/16 at [facility]

## 2016-07-04 DIAGNOSIS — E109 Type 1 diabetes mellitus without complications: Secondary | ICD-10-CM | POA: Diagnosis not present

## 2016-07-05 DIAGNOSIS — Z6829 Body mass index (BMI) 29.0-29.9, adult: Secondary | ICD-10-CM | POA: Diagnosis not present

## 2016-07-05 DIAGNOSIS — Z1389 Encounter for screening for other disorder: Secondary | ICD-10-CM | POA: Diagnosis not present

## 2016-07-05 DIAGNOSIS — E038 Other specified hypothyroidism: Secondary | ICD-10-CM | POA: Diagnosis not present

## 2016-07-05 DIAGNOSIS — E1065 Type 1 diabetes mellitus with hyperglycemia: Secondary | ICD-10-CM | POA: Diagnosis not present

## 2016-07-20 DIAGNOSIS — E109 Type 1 diabetes mellitus without complications: Secondary | ICD-10-CM | POA: Diagnosis not present

## 2016-07-21 MED FILL — LEVOTHYROXINE 125 MCG TABLE: 125 | 30 days supply | Qty: 30 | Fill #1

## 2016-09-21 MED FILL — LEVOTHYROXINE 125 MCG TABLE: 125 | 30 days supply | Qty: 30 | Fill #2

## 2016-09-21 MED FILL — FREESTYLE LITE TEST STRIP: 25 days supply | Qty: 100 | Fill #0

## 2016-09-21 MED FILL — HumaLOG 100 UNIT/ML SOLN: 100 | 88 days supply | Qty: 70 | Fill #1

## 2016-09-24 ENCOUNTER — Other Ambulatory Visit: Payer: Self-pay

## 2016-09-24 VITALS — BP 130/88 | HR 72 | Resp 16 | Ht 71.0 in | Wt 213.0 lb

## 2016-09-24 DIAGNOSIS — E109 Type 1 diabetes mellitus without complications: Secondary | ICD-10-CM

## 2016-09-24 NOTE — Patient Instructions (Signed)
1. Plan to exercise 4 times a week 30 minutes, play basketball, and play tennis for 60 minutes 1-2 times a week. 2. Plan to eat snacks with protein while at work 3. Plan to keep 12/03/16 appt with  Dr. Rinaldo Cloud[place]'s office  4. Plan to see Link to Wellness on 01/03/17 at [facility]9AM

## 2016-09-24 NOTE — Patient Outreach (Signed)
[facility]) Care Management   09/24/2016  Lee West [birth year] [id number]  Lee West is an 31 y.o. male.   Member seen for follow up office visit for Link to Wellness program for self management of Type 1 diabetes  Subjective:  Member states that he saw Lee West in April and his hemoglobin A1C has gone down to 8.2%.  States he is working on his portion sizes and trying to watch what he eats when he is working at night.  States he is playing tennis at least once a week along with playing basketball once a week.  States he is still riding his exercise bike several times a week and being active with his kids since school is out.  States he only goes low about 1-2 times a month.    Objective:   ROS  Reviewed glucometer 7 day average -187 14 day average-195 30 day average-223  Physical Exam Today's Vitals   09/24/16 0847 09/24/16 0853  BP: 130/88   Pulse: 72   Resp: 16   SpO2: 97%   Weight: 213 lb (96.6 kg)   Height: 1.803 m (5\' 11" )   PainSc: 0-No pain 0-No pain   Encounter Medications:   Outpatient Encounter Prescriptions as of 09/24/2016  Medication Sig Note  . insulin lispro (HUMALOG) 100 UNIT/ML injection Inject into the skin See admin instructions. Use in insulin pump   . levothyroxine (SYNTHROID, LEVOTHROID) 125 MCG tablet Take 125 mcg by mouth daily before breakfast.   . insulin aspart (NOVOLOG) 100 UNIT/ML injection Inject into the skin once. 05/14/2013: Used in insulin pump   No facility-administered encounter medications on file as of 09/24/2016.     Functional Status:   In your present state of health, do you have any difficulty performing the following activities: 09/24/2016 06/18/2016  Hearing? N N  Vision? N N  Difficulty concentrating or making decisions? N N  Walking or climbing stairs? N N  Dressing or bathing? N N  Doing errands, shopping? - N  Some recent data might be hidden    Fall/Depression Screening:    Fall Risk  09/24/2016  06/18/2016 06/19/2014  Falls in the past year? No No No   PHQ 2/9 Scores 09/24/2016 06/18/2016  PHQ - 2 Score 0 0    Assessment:  Member seen for follow up office visit for Link to Wellness program for self management of Type 1 diabetes.  Member not meeting diabetes self-management goal of 7% or below with last reading decreased to 8.2%.  Member reports increasing his exercise.  Member working on portion control at meals and making wise choices when eating out.  Member to see Lee West on 11/02/16.  Member over due for eye exam and up to date with dental exams.  Member has eye exam scheduled for 12/03/16 Plan:   Plan to exercise 4 times a week 30 minutes, play basketball, and play tennis for 60 minutes 1-2 times a week. Plan to eat snacks with protein while at work Plan to keep 12/03/16 appt with  Lee West office  Plan to see Lee West to Wellness on 01/03/17 at [facility]  [facility] CM Care Plan Problem One     Most Recent Value  Care Plan Problem One  Elevated blood sugars related to dx of Type I DM as evidenced by hemoglobin A1C of 8.7%  Role Documenting the Problem One  Care Management Coordinator  Care Plan for Problem One  Active  [facility] Long Term Goal  Member will lower hemoglobin A1C by 1% in the next 90 days  [facility] Long Term Goal Start Date  09/24/16 [Continue last hemoglobin A1C 8.2%]  Interventions for Problem One Long Term Goal  Reviewed CHO counting and portion sizes, Reinforced to eat more protein with his snacks,, Again discussed upgarding to the Medtronic 670G when he is eligible for an upgrade, Instructed to keep eye appt and reviewed importance of regular eye exams     [facility] CM Care Plan Problem Two     Most Recent Value  Care Plan Problem Two      Care Plan for Problem Two  Not Active    Dudley MajorMelissa Taelor Waymire RN, [facility] Care Management Coordinator-Link to Wellness [facility] Care Management [phone]

## 2016-10-12 DIAGNOSIS — E109 Type 1 diabetes mellitus without complications: Secondary | ICD-10-CM | POA: Diagnosis not present

## 2016-10-27 DIAGNOSIS — E109 Type 1 diabetes mellitus without complications: Secondary | ICD-10-CM | POA: Diagnosis not present

## 2016-11-02 DIAGNOSIS — E1065 Type 1 diabetes mellitus with hyperglycemia: Secondary | ICD-10-CM | POA: Diagnosis not present

## 2016-11-02 DIAGNOSIS — E038 Other specified hypothyroidism: Secondary | ICD-10-CM | POA: Diagnosis not present

## 2016-11-02 DIAGNOSIS — Z6829 Body mass index (BMI) 29.0-29.9, adult: Secondary | ICD-10-CM | POA: Diagnosis not present

## 2016-11-02 MED FILL — AZELASTINE HCL 137 MCG SPRY: 0.1 | 90 days supply | Qty: 30 | Fill #0

## 2016-11-02 MED FILL — FREESTYLE LIBRE SENSOR SYST: 30 days supply | Qty: 3 | Fill #0

## 2016-12-03 DIAGNOSIS — E113291 Type 2 diabetes mellitus with mild nonproliferative diabetic retinopathy without macular edema, right eye: Secondary | ICD-10-CM | POA: Diagnosis not present

## 2016-12-06 MED FILL — LEVOTHYROXINE 125 MCG TABLE: 125 | 30 days supply | Qty: 30 | Fill #3

## 2016-12-06 MED FILL — FREESTYLE LIBRE SENSOR SYST: 30 days supply | Qty: 3 | Fill #1

## 2017-01-03 ENCOUNTER — Other Ambulatory Visit: Payer: Self-pay

## 2017-01-03 VITALS — BP 128/82 | HR 69 | Resp 16 | Ht 71.0 in | Wt 214.0 lb

## 2017-01-03 DIAGNOSIS — E109 Type 1 diabetes mellitus without complications: Secondary | ICD-10-CM

## 2017-01-03 NOTE — Patient Instructions (Signed)
1. Plan to exercise 4 times a week 30 minutes, play basketball, and ride exercise bike for 60 minutes 2-3 times a week. 2. Plan to continue to eat snacks with protein while at work 3. Plan to keep 03/23/17 appt with  Dr. Rinaldo Cloud[place]'s office  4. Plan to see Link to Wellness on 04/21/17 at [facility]9AM

## 2017-01-03 NOTE — Patient Outreach (Signed)
[facility]) Care Management   01/03/2017  Lee West [birth year] [id number]  Lee West is an 31 y.o. male.   Member seen for follow up office visit for Link to Wellness program for self management of Type 1 diabetes  Subjective:  Member states that he is now using the [employer] sensor.  States that his hemoglobin A1C has gone up to 9%.  States that Dr.South did not change any of his insulin pump setting and is waiting to see if wearing the sensor will make a difference.  States he has been playing basketball and playing with his children for exercise.  Reports most of his lower reading are several hours after he has exercised.  States he feels low at 65-70 range.  Objective:   ROS  Physical Exam Today's Vitals   01/03/17 0901  BP: 128/82  Pulse: 69  Resp: 16  SpO2: 96%  Weight: 214 lb (97.1 kg)  Height: 1.803 m (5\' 11" )  PainSc: 0-No pain   Encounter Medications:   Outpatient Encounter Prescriptions as of 01/03/2017  Medication Sig Note  . insulin lispro (HUMALOG) 100 UNIT/ML injection Inject into the skin See admin instructions. Use in insulin pump   . levothyroxine (SYNTHROID, LEVOTHROID) 125 MCG tablet Take 125 mcg by mouth daily before breakfast.   . insulin aspart (NOVOLOG) 100 UNIT/ML injection Inject into the skin once. 05/14/2013: Used in insulin pump   No facility-administered encounter medications on file as of 01/03/2017.     Functional Status:   In your present state of health, do you have any difficulty performing the following activities: 01/03/2017 09/24/2016  Hearing? N N  Vision? N N  Difficulty concentrating or making decisions? N N  Walking or climbing stairs? N N  Dressing or bathing? N N  Doing errands, shopping? N -  Some recent data might be hidden    Fall/Depression Screening:    Fall Risk  01/03/2017 09/24/2016 06/18/2016  Falls in the past year? No No No   PHQ 2/9 Scores 01/03/2017 09/24/2016 06/18/2016  PHQ - 2  Score 0 0 0    Assessment:  Member seen for follow up office visit for Link to Wellness program for self management of Type 1diabetes. Member not meeting diabetes self-management goal of 7% or below with last reading increased to 9%. Member now wearing the [employer] sensor.  Time in range for last month 34%, 64% over range and 1% below Member to see Dr.South on 03/23/17. Member had  eye exam on 12/03/16 and is up to date with dental exams.    Plan:   Plan to exercise 4 times a week 30 minutes, play basketball, and ride exercise bike for 60 minutes 2-3 times a week. Plan to continue to eat snacks with protein while at work Plan to keep 03/23/17 appt with  [name]  Plan to see [name] to Wellness on 04/21/17 at [facility]  [facility] CM Care Plan Problem One     Most Recent Value  Care Plan Problem One  Elevated blood sugars related to dx of Type I DM as evidenced by hemoglobin A1C of 8.7%  Role Documenting the Problem One  Care Management Coordinator  Care Plan for Problem One  Active  [facility] Long Term Goal   Member will lower hemoglobin A1C by 1% in the next 90 days  [facility] Long Term Goal Start Date  01/03/17 [Continue last hemoglobin A1C 9%]  Interventions for Problem One Long Term Goal  Reviewed CHO counting and portion sizes, Reinforced to eat more protein with his snacks,, Again discussed upgarding to the [employer] 670G when he is eligible for an upgrade, Encouraged to discuss with provider about changing his pump setting or his insulin to CHO ratio to help decrease his blood sugars    [facility] CM Care Plan Problem Two     Most Recent Value  Care Plan Problem Two      Care Plan for Problem Two  Not Active    Dudley MajorMelissa Larisa Lanius RN, [facility] CenterBSN,CCM Care Management Coordinator-Link to Wellness [facility] Care Management [phone]

## 2017-01-07 MED FILL — LEVOTHYROXINE 125 MCG TABLE: 125 | 30 days supply | Qty: 30 | Fill #4

## 2017-01-07 MED FILL — HumaLOG 100 UNIT/ML SOLN: 100 | 88 days supply | Qty: 70 | Fill #2

## 2017-01-07 MED FILL — FREESTYLE LITE TEST STRIP: 25 days supply | Qty: 100 | Fill #1

## 2017-01-07 MED FILL — FREESTYLE LIBRE SENSOR SYST: 30 days supply | Qty: 3 | Fill #2

## 2017-01-27 ENCOUNTER — Other Ambulatory Visit: Payer: Self-pay

## 2017-01-27 NOTE — Patient Outreach (Signed)
[facility]) Care Management  01/27/2017  Lee RochesterJames L West January 10, 1986 [id number]   Telephone call to inform of closing of Link to Wellness case and transition to Active [employer].  Instructed that he will be transitioned to Active Health Management in 2019 for disease management and the Link to Wellness program will be closed. Instructed that he will be contacted by [employer] by phone in January 2019.   Instructed that he will continue to receive the pharmacy benefit.    [employer] will contact member in January to continue diabetes disease management. Case closed for Link to Wellness as member will be enrolled in an external program. Member and provider to be sent letter on transition to Active Health Management  Dudley MajorMelissa Maeci Kalbfleisch RN, [facility] CenterBSN,CCM Care Management Coordinator-Link to Wellness [facility] Care Management [phone]

## 2017-01-31 DIAGNOSIS — E109 Type 1 diabetes mellitus without complications: Secondary | ICD-10-CM | POA: Diagnosis not present

## 2017-02-07 DIAGNOSIS — E109 Type 1 diabetes mellitus without complications: Secondary | ICD-10-CM | POA: Diagnosis not present

## 2017-02-14 MED FILL — FREESTYLE LIBRE SENSOR SYST: 30 days supply | Qty: 3 | Fill #3

## 2017-03-22 MED FILL — LEVOTHYROXINE 125 MCG TAB: 125 | 30 days supply | Qty: 30 | Fill #0

## 2017-03-23 DIAGNOSIS — Z6829 Body mass index (BMI) 29.0-29.9, adult: Secondary | ICD-10-CM | POA: Diagnosis not present

## 2017-03-23 DIAGNOSIS — E1065 Type 1 diabetes mellitus with hyperglycemia: Secondary | ICD-10-CM | POA: Diagnosis not present

## 2017-03-23 DIAGNOSIS — Z1389 Encounter for screening for other disorder: Secondary | ICD-10-CM | POA: Diagnosis not present

## 2017-03-23 DIAGNOSIS — E038 Other specified hypothyroidism: Secondary | ICD-10-CM | POA: Diagnosis not present

## 2017-03-31 MED FILL — FREESTYLE LIBRE SENSOR SYST: 30 days supply | Qty: 3 | Fill #4

## 2017-04-13 MED FILL — FREESTYLE LITE TEST STRIP: 25 days supply | Qty: 100 | Fill #2

## 2017-04-21 ENCOUNTER — Ambulatory Visit: Payer: 59

## 2017-04-22 MED FILL — HumaLOG 100 UNIT/ML SOLN: 100 | 87 days supply | Qty: 70 | Fill #0

## 2017-05-02 MED FILL — LEVOTHYROXINE 125 MCG TAB: 125 | 30 days supply | Qty: 30 | Fill #1

## 2017-05-02 MED FILL — FREESTYLE LIBRE SENSOR SYST: 30 days supply | Qty: 3 | Fill #5

## 2017-05-11 DIAGNOSIS — E109 Type 1 diabetes mellitus without complications: Secondary | ICD-10-CM | POA: Diagnosis not present

## 2017-05-12 DIAGNOSIS — E109 Type 1 diabetes mellitus without complications: Secondary | ICD-10-CM | POA: Diagnosis not present

## 2017-05-27 DIAGNOSIS — E1065 Type 1 diabetes mellitus with hyperglycemia: Secondary | ICD-10-CM | POA: Diagnosis not present

## 2017-05-27 DIAGNOSIS — Z4681 Encounter for fitting and adjustment of insulin pump: Secondary | ICD-10-CM | POA: Diagnosis not present

## 2017-05-27 DIAGNOSIS — Z6829 Body mass index (BMI) 29.0-29.9, adult: Secondary | ICD-10-CM | POA: Diagnosis not present

## 2017-06-17 ENCOUNTER — Other Ambulatory Visit (HOSPITAL_COMMUNITY): Payer: Self-pay | Admitting: Endocrinology

## 2017-06-17 DIAGNOSIS — R202 Paresthesia of skin: Secondary | ICD-10-CM | POA: Diagnosis not present

## 2017-06-17 DIAGNOSIS — E1065 Type 1 diabetes mellitus with hyperglycemia: Secondary | ICD-10-CM | POA: Diagnosis not present

## 2017-06-17 DIAGNOSIS — E038 Other specified hypothyroidism: Secondary | ICD-10-CM | POA: Diagnosis not present

## 2017-06-17 DIAGNOSIS — Z1389 Encounter for screening for other disorder: Secondary | ICD-10-CM | POA: Diagnosis not present

## 2017-06-17 DIAGNOSIS — Z6829 Body mass index (BMI) 29.0-29.9, adult: Secondary | ICD-10-CM | POA: Diagnosis not present

## 2017-06-17 DIAGNOSIS — R51 Headache: Secondary | ICD-10-CM | POA: Diagnosis not present

## 2017-06-17 DIAGNOSIS — E109 Type 1 diabetes mellitus without complications: Secondary | ICD-10-CM | POA: Diagnosis not present

## 2017-06-17 DIAGNOSIS — R519 Headache, unspecified: Secondary | ICD-10-CM

## 2017-06-17 MED FILL — FREESTYLE LITE TEST STRIP: 25 days supply | Qty: 100 | Fill #3

## 2017-06-17 MED FILL — LEVOTHYROXINE 125 MCG TAB: 125 | 30 days supply | Qty: 30 | Fill #2

## 2017-06-18 ENCOUNTER — Ambulatory Visit (HOSPITAL_COMMUNITY)
Admission: RE | Admit: 2017-06-18 | Discharge: 2017-06-18 | Disposition: A | Discharge status: Home / Self Care | Payer: 59 | Source: Ambulatory Visit | Attending: Endocrinology | Admitting: Endocrinology

## 2017-06-18 DIAGNOSIS — R51 Headache: Secondary | ICD-10-CM | POA: Insufficient documentation

## 2017-06-18 DIAGNOSIS — R609 Edema, unspecified: Secondary | ICD-10-CM | POA: Diagnosis not present

## 2017-06-18 DIAGNOSIS — R519 Headache, unspecified: Secondary | ICD-10-CM

## 2017-06-18 DIAGNOSIS — R202 Paresthesia of skin: Secondary | ICD-10-CM | POA: Diagnosis not present

## 2017-06-18 DIAGNOSIS — J328 Other chronic sinusitis: Secondary | ICD-10-CM | POA: Diagnosis not present

## 2017-06-18 MED ORDER — GADOBENATE DIMEGLUMINE 529 MG/ML IV SOLN
20.0000 mL | Freq: Once | INTRAVENOUS | Status: AC | PRN
  Administered 2017-06-18: 20 mL via INTRAVENOUS

## 2017-07-25 MED FILL — FREESTYLE LITE TEST STRIP: 25 days supply | Qty: 100 | Fill #4

## 2017-07-25 MED FILL — HumaLOG 100 UNIT/ML SOLN: 100 | 87 days supply | Qty: 70 | Fill #1

## 2017-07-25 MED FILL — LEVOTHYROXINE 137 MCG TABLE: 137 | 90 days supply | Qty: 90 | Fill #0

## 2017-07-27 DIAGNOSIS — Z6829 Body mass index (BMI) 29.0-29.9, adult: Secondary | ICD-10-CM | POA: Diagnosis not present

## 2017-07-27 DIAGNOSIS — E1065 Type 1 diabetes mellitus with hyperglycemia: Secondary | ICD-10-CM | POA: Diagnosis not present

## 2017-07-27 DIAGNOSIS — E038 Other specified hypothyroidism: Secondary | ICD-10-CM | POA: Diagnosis not present

## 2017-07-27 DIAGNOSIS — E11319 Type 2 diabetes mellitus with unspecified diabetic retinopathy without macular edema: Secondary | ICD-10-CM | POA: Diagnosis not present

## 2017-07-27 DIAGNOSIS — E7849 Other hyperlipidemia: Secondary | ICD-10-CM | POA: Diagnosis not present

## 2017-07-27 DIAGNOSIS — Z1389 Encounter for screening for other disorder: Secondary | ICD-10-CM | POA: Diagnosis not present

## 2017-08-01 DIAGNOSIS — E109 Type 1 diabetes mellitus without complications: Secondary | ICD-10-CM | POA: Diagnosis not present

## 2017-08-15 DIAGNOSIS — E109 Type 1 diabetes mellitus without complications: Secondary | ICD-10-CM | POA: Diagnosis not present

## 2017-10-24 MED FILL — FREESTYLE LITE TEST STRIP: 75 days supply | Qty: 300 | Fill #0

## 2017-10-24 MED FILL — LEVOTHYROXINE 137 MCG TABLE: 137 | 90 days supply | Qty: 90 | Fill #1

## 2017-10-24 MED FILL — HumaLOG 100 UNIT/ML SOLN: 100 | 87 days supply | Qty: 70 | Fill #2

## 2017-11-09 DIAGNOSIS — E109 Type 1 diabetes mellitus without complications: Secondary | ICD-10-CM | POA: Diagnosis not present

## 2017-11-11 DIAGNOSIS — E109 Type 1 diabetes mellitus without complications: Secondary | ICD-10-CM | POA: Diagnosis not present

## 2017-12-02 DIAGNOSIS — E113292 Type 2 diabetes mellitus with mild nonproliferative diabetic retinopathy without macular edema, left eye: Secondary | ICD-10-CM | POA: Diagnosis not present

## 2018-01-10 DIAGNOSIS — E109 Type 1 diabetes mellitus without complications: Secondary | ICD-10-CM | POA: Diagnosis not present

## 2018-01-10 DIAGNOSIS — E038 Other specified hypothyroidism: Secondary | ICD-10-CM | POA: Diagnosis not present

## 2018-01-10 DIAGNOSIS — R51 Headache: Secondary | ICD-10-CM | POA: Diagnosis not present

## 2018-01-10 DIAGNOSIS — Z6831 Body mass index (BMI) 31.0-31.9, adult: Secondary | ICD-10-CM | POA: Diagnosis not present

## 2018-01-10 DIAGNOSIS — E7849 Other hyperlipidemia: Secondary | ICD-10-CM | POA: Diagnosis not present

## 2018-01-18 ENCOUNTER — Ambulatory Visit (INDEPENDENT_AMBULATORY_CARE_PROVIDER_SITE_OTHER): Payer: Self-pay | Admitting: Nurse Practitioner

## 2018-01-18 ENCOUNTER — Encounter: Payer: Self-pay | Admitting: Nurse Practitioner

## 2018-01-18 VITALS — BP 121/82 | HR 102 | Temp 98.9°F | Wt 223.0 lb

## 2018-01-18 DIAGNOSIS — H109 Unspecified conjunctivitis: Secondary | ICD-10-CM

## 2018-01-18 DIAGNOSIS — R05 Cough: Secondary | ICD-10-CM

## 2018-01-18 DIAGNOSIS — B9789 Other viral agents as the cause of diseases classified elsewhere: Secondary | ICD-10-CM

## 2018-01-18 DIAGNOSIS — B9689 Other specified bacterial agents as the cause of diseases classified elsewhere: Secondary | ICD-10-CM

## 2018-01-18 DIAGNOSIS — R059 Cough, unspecified: Secondary | ICD-10-CM

## 2018-01-18 DIAGNOSIS — J329 Chronic sinusitis, unspecified: Secondary | ICD-10-CM

## 2018-01-18 MED ORDER — POLYMYXIN B-TRIMETHOPRIM 10000-0.1 UNIT/ML-% OP SOLN: 2.0000 [drp] | OPHTHALMIC | 0 refills | Status: AC

## 2018-01-18 MED ORDER — PSEUDOEPH-BROMPHEN-DM 30-2-10 MG/5ML PO SYRP: 5.0000 mL | ORAL_SOLUTION | Freq: Four times a day (QID) | ORAL | 0 refills | Status: AC | PRN

## 2018-01-18 MED FILL — BROMPHENIR-PSEUDOEPHED-DM S: 30-2-10 | 7 days supply | Qty: 150 | Fill #0

## 2018-01-18 MED FILL — POLYMYXIN B/TMP EYE DROPS: 10000-0.1 | 17 days supply | Qty: 10 | Fill #0

## 2018-01-18 NOTE — Patient Instructions (Addendum)
Bacterial Conjunctivitis -Take medications as prescribed. -Continue ibuprofen 800 mg every 8 hours for the next 3 days.  May stop sooner if symptoms improve. -Cool compresses to both eyes for comfort. -Strict hand hygiene until symptoms improve. -Use humidifier or vaporizer when at home and during sleep to help with cough. -Sleep elevated on at least 2 pillows at bedtime to help with cough. -Use a teaspoon of honey as needed for sore throat and cough. -Increase fluids. -Follow-up in our office in the next 3 to 5 days if symptoms do not improve.  Bacterial conjunctivitis is an infection of the clear membrane that covers the white part of your eye and the inner surface of your eyelid (conjunctiva). When the blood vessels in your conjunctiva become inflamed, your eye becomes red or pink, and it will probably feel itchy. Bacterial conjunctivitis spreads very easily from person to person (is contagious). It also spreads easily from one eye to the other eye. What are the causes? This condition is caused by several common bacteria. You may get the infection if you come into close contact with another person who is infected. You may also come into contact with items that are contaminated with the bacteria, such as a face towel, contact lens solution, or eye makeup. What increases the risk? This condition is more likely to develop in people who:  Are exposed to other people who have the infection.  Wear contact lenses.  Have a sinus infection.  Have had a recent eye injury or surgery.  Have a weak body defense system (immune system).  Have a medical condition that causes dry eyes.  What are the signs or symptoms? Symptoms of this condition include:  Eye redness.  Tearing or watery eyes.  Itchy eyes.  Burning feeling in your eyes.  Thick, yellowish discharge from an eye. This may turn into a crust on the eyelid overnight and cause your eyelids to stick together.  Swollen  eyelids.  Blurred vision.  How is this diagnosed? Your health care provider can diagnose this condition based on your symptoms and medical history. Your health care provider may also take a sample of discharge from your eye to find the cause of your infection. This is rarely done. How is this treated? Treatment for this condition includes:  Antibiotic eye drops or ointment to clear the infection more quickly and prevent the spread of infection to others.  Oral antibiotic medicines to treat infections that do not respond to drops or ointments, or last longer than 10 days.  Cool, wet cloths (cool compresses) placed on the eyes.  Artificial tears applied 2-6 times a day.  Follow these instructions at home: Medicines  Take or apply your antibiotic medicine as told by your health care provider. Do not stop taking or applying the antibiotic even if you start to feel better.  Take or apply over-the-counter and prescription medicines only as told by your health care provider.  Be very careful to avoid touching the edge of your eyelid with the eye drop bottle or the ointment tube when you apply medicines to the affected eye. This will keep you from spreading the infection to your other eye or to other people. Managing discomfort  Gently wipe away any drainage from your eye with a warm, wet washcloth or a cotton ball.  Apply a cool, clean washcloth to your eye for 10-20 minutes, 3-4 times a day. General instructions  Do not wear contact lenses until the inflammation is gone and your health  care provider says it is safe to wear them again. Ask your health care provider how to sterilize or replace your contact lenses before you use them again. Wear glasses until you can resume wearing contacts.  Avoid wearing eye makeup until the inflammation is gone. Throw away any old eye cosmetics that may be contaminated.  Change or wash your pillowcase every day.  Do not share towels or washcloths.  This may spread the infection.  Wash your hands often with soap and water. Use paper towels to dry your hands.  Avoid touching or rubbing your eyes.  Do not drive or use heavy machinery if your vision is blurred. Contact a health care provider if:  You have a fever.  Your symptoms do not get better after 10 days. Get help right away if:  You have a fever and your symptoms suddenly get worse.  You have severe pain when you move your eye.  You have facial pain, redness, or swelling.  You have sudden loss of vision. This information is not intended to replace advice given to you by your health care provider. Make sure you discuss any questions you have with your health care provider. Document Released: 03/01/2005 Document Revised: 07/10/2015 Document Reviewed: 12/12/2014 Elsevier Interactive Patient Education  2017 Elsevier Inc.  Cough, Adult Coughing is a reflex that clears your throat and your airways. Coughing helps to heal and protect your lungs. It is normal to cough occasionally, but a cough that happens with other symptoms or lasts a long time may be a sign of a condition that needs treatment. A cough may last only 2-3 weeks (acute), or it may last longer than 8 weeks (chronic). What are the causes? Coughing is commonly caused by:  Breathing in substances that irritate your lungs.  A viral or bacterial respiratory infection.  Allergies.  Asthma.  Postnasal drip.  Smoking.  Acid backing up from the stomach into the esophagus (gastroesophageal reflux).  Certain medicines.  Chronic lung problems, including COPD (or rarely, lung cancer).  Other medical conditions such as heart failure.  Follow these instructions at home: Pay attention to any changes in your symptoms. Take these actions to help with your discomfort:  Take medicines only as told by your health care provider. ? If you were prescribed an antibiotic medicine, take it as told by your health care  provider. Do not stop taking the antibiotic even if you start to feel better. ? Talk with your health care provider before you take a cough suppressant medicine.  Drink enough fluid to keep your urine clear or pale yellow.  If the air is dry, use a cold steam vaporizer or humidifier in your bedroom or your home to help loosen secretions.  Avoid anything that causes you to cough at work or at home.  If your cough is worse at night, try sleeping in a semi-upright position.  Avoid cigarette smoke. If you smoke, quit smoking. If you need help quitting, ask your health care provider.  Avoid caffeine.  Avoid alcohol.  Rest as needed.  Contact a health care provider if:  You have new symptoms.  You cough up pus.  Your cough does not get better after 2-3 weeks, or your cough gets worse.  You cannot control your cough with suppressant medicines and you are losing sleep.  You develop pain that is getting worse or pain that is not controlled with pain medicines.  You have a fever.  You have unexplained weight loss.  You  have night sweats. Get help right away if:  You cough up blood.  You have difficulty breathing.  Your heartbeat is very fast. This information is not intended to replace advice given to you by your health care provider. Make sure you discuss any questions you have with your health care provider. Document Released: 08/28/2010 Document Revised: 08/07/2015 Document Reviewed: 05/08/2014 Elsevier Interactive Patient Education  2018 [employer].  Sinusitis, Adult Sinusitis is soreness and inflammation of your sinuses. Sinuses are hollow spaces in the bones around your face. Your sinuses are located:  Around your eyes.  In the middle of your forehead.  Behind your nose.  In your cheekbones.  Your sinuses and nasal passages are lined with a stringy fluid (mucus). Mucus normally drains out of your sinuses. When your nasal tissues become inflamed or swollen, the  mucus can become trapped or blocked so air cannot flow through your sinuses. This allows bacteria, viruses, and funguses to grow, which leads to infection. Sinusitis can develop quickly and last for 7?10 days (acute) or for more than 12 weeks (chronic). Sinusitis often develops after a cold. What are the causes? This condition is caused by anything that creates swelling in the sinuses or stops mucus from draining, including:  Allergies.  Asthma.  Bacterial or viral infection.  Abnormally shaped bones between the nasal passages.  Nasal growths that contain mucus (nasal polyps).  Narrow sinus openings.  Pollutants, such as chemicals or irritants in the air.  A foreign object stuck in the nose.  A fungal infection. This is rare.  What increases the risk? The following factors may make you more likely to develop this condition:  Having allergies or asthma.  Having had a recent cold or respiratory tract infection.  Having structural deformities or blockages in your nose or sinuses.  Having a weak immune system.  Doing a lot of swimming or diving.  Overusing nasal sprays.  Smoking.  What are the signs or symptoms? The main symptoms of this condition are pain and a feeling of pressure around the affected sinuses. Other symptoms include:  Upper toothache.  Earache.  Headache.  Bad breath.  Decreased sense of smell and taste.  A cough that may get worse at night.  Fatigue.  Fever.  Thick drainage from your nose. The drainage is often green and it may contain pus (purulent).  Stuffy nose or congestion.  Postnasal drip. This is when extra mucus collects in the throat or back of the nose.  Swelling and warmth over the affected sinuses.  Sore throat.  Sensitivity to light.  How is this diagnosed? This condition is diagnosed based on symptoms, a medical history, and a physical exam. To find out if your condition is acute or chronic, your health care provider  may:  Look in your nose for signs of nasal polyps.  Tap over the affected sinus to check for signs of infection.  View the inside of your sinuses using an imaging device that has a light attached (endoscope).  If your health care provider suspects that you have chronic sinusitis, you may also:  Be tested for allergies.  Have a sample of mucus taken from your nose (nasal culture) and checked for bacteria.  Have a mucus sample examined to see if your sinusitis is related to an allergy.  If your sinusitis does not respond to treatment and it lasts longer than 8 weeks, you may have an MRI or CT scan to check your sinuses. These scans also help  to determine how severe your infection is. In rare cases, a bone biopsy may be done to rule out more serious types of fungal sinus disease. How is this treated? Treatment for sinusitis depends on the cause and whether your condition is chronic or acute. If a virus is causing your sinusitis, your symptoms will go away on their own within 10 days. You may be given medicines to relieve your symptoms, including:  Topical nasal decongestants. They shrink swollen nasal passages and let mucus drain from your sinuses.  Antihistamines. These drugs block inflammation that is triggered by allergies. This can help to ease swelling in your nose and sinuses.  Topical nasal corticosteroids. These are nasal sprays that ease inflammation and swelling in your nose and sinuses.  Nasal saline washes. These rinses can help to get rid of thick mucus in your nose.  If your condition is caused by bacteria, you will be given an antibiotic medicine. If your condition is caused by a fungus, you will be given an antifungal medicine. Surgery may be needed to correct underlying conditions, such as narrow nasal passages. Surgery may also be needed to remove polyps. Follow these instructions at home: Medicines  Take, use, or apply over-the-counter and prescription medicines only  as told by your health care provider. These may include nasal sprays.  If you were prescribed an antibiotic medicine, take it as told by your health care provider. Do not stop taking the antibiotic even if you start to feel better. Hydrate and Humidify  Drink enough water to keep your urine clear or pale yellow. Staying hydrated will help to thin your mucus.  Use a cool mist humidifier to keep the humidity level in your home above 50%.  Inhale steam for 10-15 minutes, 3-4 times a day or as told by your health care provider. You can do this in the bathroom while a hot shower is running.  Limit your exposure to cool or dry air. Rest  Rest as much as possible.  Sleep with your head raised (elevated).  Make sure to get enough sleep each night. General instructions  Apply a warm, moist washcloth to your face 3-4 times a day or as told by your health care provider. This will help with discomfort.  Wash your hands often with soap and water to reduce your exposure to viruses and other germs. If soap and water are not available, use hand sanitizer.  Do not smoke. Avoid being around people who are smoking (secondhand smoke).  Keep all follow-up visits as told by your health care provider. This is important. Contact a health care provider if:  You have a fever.  Your symptoms get worse.  Your symptoms do not improve within 10 days. Get help right away if:  You have a severe headache.  You have persistent vomiting.  You have pain or swelling around your face or eyes.  You have vision problems.  You develop confusion.  Your neck is stiff.  You have trouble breathing. This information is not intended to replace advice given to you by your health care provider. Make sure you discuss any questions you have with your health care provider. Document Released: 03/01/2005 Document Revised: 10/26/2015 Document Reviewed: 12/25/2014 Elsevier Interactive Patient Education  [employer].

## 2018-01-18 NOTE — Progress Notes (Addendum)
Subjective:    Patient ID: Lee West, male    DOB: 22-Aug-1985, 32 y.o.   MRN: [id number]  The patient is a 32 year old male who presents today for complaints of bilateral eye drainage and sore throat.  The patient states his eyes started draining about 2 days ago.  Patient states it was more noticeable last night while he was at work.  The drainage has been consistent or persistent throughout the day.  Patient does complain of eye itching, mild eye pain that he rates 2/10 at present, mucus appearing drainage, and mild swelling.  The patient denies any difficulty seeing or change in his vision.  The patient further denies fever, chills, ear ache or drainage.  The patient does have a history of type 1 diabetes.  Patient currently wears an insulin pump.  Conjunctivitis   The current episode started 2 days ago. The onset was sudden. The problem has been gradually worsening. The problem is moderate. Nothing aggravates the symptoms. Associated symptoms include eye itching, sore throat, cough, URI, eye discharge, eye pain and eye redness. Pertinent negatives include no fever. The eye pain is moderate. Both eyes are affected.The eye pain is not associated with movement. The eyelid exhibits swelling and redness. He has been experiencing a mild sore throat. Neither side is more painful than the other. The sore throat is characterized by pain only. He has been eating and drinking normally.  Sore Throat   Associated symptoms include coughing. Pertinent negatives include no trouble swallowing.   I reviewed the patient's past medical history, current medications, and allergies.   Review of Systems  Constitutional: Negative for fever.  HENT: Positive for sinus pressure and sore throat. Negative for trouble swallowing.   Eyes: Positive for pain, discharge, redness and itching.  Respiratory: Positive for cough.   Cardiovascular: Negative.   Gastrointestinal: Negative.   Skin: Negative.    Allergic/Immunologic: Negative.        Objective:   Physical Exam  Constitutional: He is oriented to person, place, and time. He appears well-developed and well-nourished. No distress.  HENT:  Head: Normocephalic.  Right Ear: External ear normal.  Left Ear: External ear normal.  Nose: Nose normal.  Mouth/Throat: Oropharynx is clear and moist.  Eyes: Right eye exhibits discharge. Left eye exhibits discharge. No scleral icterus.  Conjunctiva injected bilaterally, +  Mucus appearing drainage, dried  Neck: Normal range of motion. Neck supple. No tracheal deviation present. No thyromegaly present.  Cardiovascular: Normal rate, regular rhythm and normal heart sounds.  Pulmonary/Chest: Effort normal and breath sounds normal.  Abdominal: Soft. Bowel sounds are normal.  Lymphadenopathy:    He has no cervical adenopathy.  Neurological: He is alert and oriented to person, place, and time.  Skin: Skin is warm.  Blood pressure 121/82, pulse (!) 102, temperature 98.9 F (37.2 C), temperature source Oral, weight 223 lb (101.2 kg), SpO2 100 %.     Assessment & Plan:  Exam findings, diagnosis etiology and medication use and indications reviewed with patient. Follow- Up and discharge instructions provided. No emergent/urgent issues found on exam. Patient education was provided. Patient verbalized understanding of information provided and agrees with plan of care (POC), all questions answered. The patient is advised to call or return to clinic if condition does not see an improvement in symptoms, or to seek the care of the closest emergency department if condition worsens with the above plan.   1. Bacterial conjunctivitis of both eyes  - trimethoprim-polymyxin b (POLYTRIM) ophthalmic solution;  Place 2 drops into both eyes every 4 (four) hours for 10 days.  Dispense: 10 mL; Refill: 0 -Take medications as prescribed. -Continue ibuprofen 800 mg every 8 hours for the next 3 days.  May stop sooner if  symptoms improve. -Cool compresses to both eyes for comfort. -Strict hand hygiene until symptoms improve. -Follow-up in our office in the next 3 to 5 days if symptoms do not improve.   2. Viral sinusitis/Cough  -Fluticasone steroid nasal spray 2 sprays in each nostril daily for 10 days.  Patient informed he has this medication at home, do not need to send a prescription. - brompheniramine-pseudoephedrine-DM 30-2-10 MG/5ML syrup; Take 5 mLs by mouth 4 (four) times daily as needed for up to 7 days.  Dispense: 150 mL; Refill: 0 -Follow-up in our office in the next 3 to 5 days if symptoms do not improve. -Take medications as prescribed. -Continue ibuprofen 800 mg every 8 hours for the next 3 days.  May stop sooner if symptoms improve. -Use humidifier or vaporizer when at home and during sleep to help with cough. -Sleep elevated on at least 2 pillows at bedtime to help with cough. -Use a teaspoon of honey as needed for sore throat and cough. -Increase fluids. -Follow-up in our office in the next 3 to 5 days if symptoms do not improve.

## 2018-01-20 ENCOUNTER — Ambulatory Visit (INDEPENDENT_AMBULATORY_CARE_PROVIDER_SITE_OTHER): Payer: Self-pay | Admitting: Nurse Practitioner

## 2018-01-20 VITALS — BP 124/86 | HR 107 | Temp 99.5°F | Resp 20 | Wt 223.2 lb

## 2018-01-20 DIAGNOSIS — H6983 Other specified disorders of Eustachian tube, bilateral: Secondary | ICD-10-CM

## 2018-01-20 MED ORDER — HYDROCORTISONE-ACETIC ACID 1-2 % OT SOLN: 3.0000 [drp] | Freq: Two times a day (BID) | OTIC | 0 refills | Status: AC

## 2018-01-20 MED ORDER — AMOXICILLIN 875 MG PO TABS: 875.0000 mg | ORAL_TABLET | Freq: Two times a day (BID) | ORAL | 0 refills | Status: AC

## 2018-01-20 MED FILL — AMOXICILLIN 875 MG TABLET: 875 | 10 days supply | Qty: 20 | Fill #0

## 2018-01-20 NOTE — Patient Instructions (Signed)
Eustachian Tube Dysfunction -Take medication as prescribed. -Tylenol extra strength 500 mg 1 to 2 tablets every 6-8 hours as needed for pain or discomfort. -Benadryl 25 mg at bedtime until symptoms improve. -Warm compresses to the ear for discomfort. -Follow-up with your PCP if you develop loss of hearing change of hearing or other concerns. -Start Amoxicillin if symptoms do not improve.  The eustachian tube connects the middle ear to the back of the nose. It regulates air pressure in the middle ear by allowing air to move between the ear and nose. It also helps to drain fluid from the middle ear space. When the eustachian tube does not function properly, air pressure, fluid, or both can build up in the middle ear. Eustachian tube dysfunction can affect one or both ears. What are the causes? This condition happens when the eustachian tube becomes blocked or cannot open normally. This may result from:  Ear infections.  Colds and other upper respiratory infections.  Allergies.  Irritation, such as from cigarette smoke or acid from the stomach coming up into the esophagus (gastroesophageal reflux).  Sudden changes in air pressure, such as from descending in an airplane.  Abnormal growths in the nose or throat, such as nasal polyps, tumors, or enlarged tissue at the back of the throat (adenoids).  What increases the risk? This condition may be more likely to develop in people who smoke and people who are overweight. Eustachian tube dysfunction may also be more likely to develop in children, especially children who have:  Certain birth defects of the mouth, such as cleft palate.  Large tonsils and adenoids.  What are the signs or symptoms? Symptoms of this condition may include:  A feeling of fullness in the ear.  Ear pain.  Clicking or popping noises in the ear.  Ringing in the ear.  Hearing loss.  Loss of balance.  Symptoms may get worse when the air pressure around you  changes, such as when you travel to an area of high elevation or fly on an airplane. How is this diagnosed? This condition may be diagnosed based on:  Your symptoms.  A physical exam of your ear, nose, and throat.  Tests, such as those that measure: ? The movement of your eardrum (tympanogram). ? Your hearing (audiometry).  How is this treated? Treatment depends on the cause and severity of your condition. If your symptoms are mild, you may be able to relieve your symptoms by moving air into ("popping") your ears. If you have symptoms of fluid in your ears, treatment may include:  Decongestants.  Antihistamines.  Nasal sprays or ear drops that contain medicines that reduce swelling (steroids).  In some cases, you may need to have a procedure to drain the fluid in your eardrum (myringotomy). In this procedure, a small tube is placed in the eardrum to:  Drain the fluid.  Restore the air in the middle ear space.  Follow these instructions at home:  Take over-the-counter and prescription medicines only as told by your health care provider.  Use techniques to help pop your ears as recommended by your health care provider. These may include: ? Chewing gum. ? Yawning. ? Frequent, forceful swallowing. ? Closing your mouth, holding your nose closed, and gently blowing as if you are trying to blow air out of your nose.  Do not do any of the following until your health care provider approves: ? Travel to high altitudes. ? Fly in airplanes. ? Work in a Estate agent or  room. ? Scuba dive.  Keep your ears dry. Dry your ears completely after showering or bathing.  Do not smoke.  Keep all follow-up visits as told by your health care provider. This is important. Contact a health care provider if:  Your symptoms do not go away after treatment.  Your symptoms come back after treatment.  You are unable to pop your ears.  You have: ? A fever. ? Pain in your ear. ? Pain in  your head or neck. ? Fluid draining from your ear.  Your hearing suddenly changes.  You become very dizzy.  You lose your balance. This information is not intended to replace advice given to you by your health care provider. Make sure you discuss any questions you have with your health care provider. Document Released: 03/28/2015 Document Revised: 08/07/2015 Document Reviewed: 03/20/2014 Elsevier Interactive Patient Education  2018 [employer]  Pharyngitis Pharyngitis is redness, pain, and swelling (inflammation) of the throat (pharynx). It is a very common cause of sore throat. Pharyngitis can be caused by a bacteria, but it is usually caused by a virus. Most cases of pharyngitis get better on their own without treatment. What are the causes? This condition may be caused by:  Infection by viruses (viral). Viral pharyngitis spreads from person to person (is contagious) through coughing, sneezing, and sharing of personal items or utensils such as cups, forks, spoons, and toothbrushes.  Infection by bacteria (bacterial). Bacterial pharyngitis may be spread by touching the nose or face after coming in contact with the bacteria, or through more intimate contact, such as kissing.  Allergies. Allergies can cause buildup of mucus in the throat (post-nasal drip), leading to inflammation and irritation. Allergies can also cause blocked nasal passages, forcing breathing through the mouth, which dries and irritates the throat.  What increases the risk? You are more likely to develop this condition if:  You are 25-52 years old.  You are exposed to crowded environments such as daycare, school, or dormitory living.  You live in a cold climate.  You have a weakened disease-fighting (immune) system.  What are the signs or symptoms? Symptoms of this condition vary by the cause (viral, bacterial, or allergies) and can include:  Sore throat.  Fatigue.  Low-grade fever.  Headache.  Joint  pain and muscle aches.  Skin rashes.  Swollen glands in the throat (lymph nodes).  Plaque-like film on the throat or tonsils. This is often a symptom of bacterial pharyngitis.  Vomiting.  Stuffy nose (nasal congestion).  Cough.  Red, itchy eyes (conjunctivitis).  Loss of appetite.  How is this diagnosed? This condition is often diagnosed based on your medical history and a physical exam. Your health care provider will ask you questions about your illness and your symptoms. A swab of your throat may be done to check for bacteria (rapid strep test). Other lab tests may also be done, depending on the suspected cause, but these are rare. How is this treated? This condition usually gets better in 3-4 days without medicine. Bacterial pharyngitis may be treated with antibiotic medicines. Follow these instructions at home:  Take over-the-counter and prescription medicines only as told by your health care provider. ? If you were prescribed an antibiotic medicine, take it as told by your health care provider. Do not stop taking the antibiotic even if you start to feel better. ? Do not give children aspirin because of the association with Reye syndrome.  Drink enough water and fluids to keep your urine clear  or pale yellow.  Get a lot of rest.  Gargle with a salt-water mixture 3-4 times a day or as needed. To make a salt-water mixture, completely dissolve -1 tsp of salt in 1 cup of warm water.  If your health care provider approves, you may use throat lozenges or sprays to soothe your throat. Contact a health care provider if:  You have large, tender lumps in your neck.  You have a rash.  You cough up green, yellow-brown, or bloody spit. Get help right away if:  Your neck becomes stiff.  You drool or are unable to swallow liquids.  You cannot drink or take medicines without vomiting.  You have severe pain that does not go away, even after you take medicine.  You have trouble  breathing, and it is not caused by a stuffy nose.  You have new pain and swelling in your joints such as the knees, ankles, wrists, or elbows. Summary  Pharyngitis is redness, pain, and swelling (inflammation) of the throat (pharynx).  While pharyngitis can be caused by a bacteria, the most common causes are viral.  Most cases of pharyngitis get better on their own without treatment.  Bacterial pharyngitis is treated with antibiotic medicines. This information is not intended to replace advice given to you by your health care provider. Make sure you discuss any questions you have with your health care provider. Document Released: 03/01/2005 Document Revised: 04/06/2016 Document Reviewed: 04/06/2016 Elsevier Interactive Patient Education  [employer].

## 2018-01-20 NOTE — Progress Notes (Signed)
Subjective:    Patient ID: Lee West, male    DOB: 12/11/1985, 32 y.o.   MRN: [id number]  Patient is a 32 year old male who presents for complaints of bilateral ear pain and sore throat.  Patient states his symptoms started about 5 days ago.  Patient states he has been taking ibuprofen for symptoms but over the past 24 hours his symptoms have worsened.  Patient states his throat pain seems to worsen when he swallows, and this also affects his ear pain.  Patient states that ear pain is worse when he swallows.  Patient states the pain is constant, and describes the pain as "throbbing".  The patient denies fever, chills, nasal congestion cough, or ear drainage.  Patient further denies any loss of hearing or change in hearing.  Otalgia   There is pain in both ears. This is a new problem. The current episode started in the past 7 days. The problem occurs every few minutes. The problem has been unchanged. There has been no fever. The pain is at a severity of 6/10. The pain is moderate. Associated symptoms include a sore throat. Pertinent negatives include no coughing, ear discharge, rhinorrhea or vomiting. He has tried NSAIDs for the symptoms. The treatment provided mild relief. There is no history of a chronic ear infection.    I have reviewed the patient's past medical history, current medications and allergies.  Review of Systems  HENT: Positive for ear pain and sore throat. Negative for ear discharge and rhinorrhea.   Eyes: Negative.   Respiratory: Negative.  Negative for cough.   Cardiovascular: Negative.   Gastrointestinal: Negative for vomiting.  Skin: Negative.   Neurological: Negative.        Objective:   Physical Exam  Constitutional: He is oriented to person, place, and time. He appears well-developed and well-nourished. No distress.  HENT:  Head: Normocephalic and atraumatic.  Right Ear: External ear normal. No drainage or tenderness. A middle ear effusion is present. No  decreased hearing is noted.  Left Ear: External ear normal. No drainage or tenderness. A middle ear effusion is present. No decreased hearing is noted.  Mouth/Throat: Mucous membranes are normal. Posterior oropharyngeal erythema present. No oropharyngeal exudate. Tonsils are 0 on the right. Tonsils are 0 on the left.  Eyes: Pupils are equal, round, and reactive to light.  Neck: Normal range of motion. Neck supple. No tracheal deviation present. No thyromegaly present.  Cardiovascular: Normal rate, regular rhythm and normal heart sounds.  Pulmonary/Chest: Effort normal and breath sounds normal.  Abdominal: Soft. Bowel sounds are normal.  Neurological: He is alert and oriented to person, place, and time.  Skin: Skin is warm and dry.  Psychiatric: He has a normal mood and affect.      Assessment & Plan:   Exam findings, diagnosis etiology and medication use and indications reviewed with patient. Follow- Up and discharge instructions provided. No emergent/urgent issues found on exam. Patient education was provided.  Patient verbalized understanding of information provided and agrees with plan of care (POC), all questions answered. The patient is advised to call or return to clinic if condition does not see an improvement in symptoms, or to seek the care of the closest emergency department if condition worsens with the above plan.   1. Acute dysfunction of Eustachian tube, bilateral  - acetic acid-hydrocortisone (VOSOL-HC) OTIC solution; Place 3 drops into both ears 2 (two) times daily for 10 days.  Dispense: 10 mL; Refill: 0 - amoxicillin (AMOXIL) 875  MG tablet; Take 1 tablet (875 mg total) by mouth 2 (two) times daily for 10 days.  Dispense: 20 tablet; Refill: 0 -Take medication as prescribed. -Tylenol extra strength 500 mg 1 to 2 tablets every 6-8 hours as needed for pain or discomfort. -Benadryl 25 mg at bedtime until symptoms improve. -Warm compresses to the ears for discomfort. -Follow-up  with your PCP if you develop loss of hearing change of hearing or other concerns. -Start Amoxicillin if symptoms do not improve.

## 2018-01-25 MED FILL — LEVOTHYROXINE 137 MCG TABLE: 137 | 90 days supply | Qty: 90 | Fill #0

## 2018-01-25 MED FILL — FREESTYLE LITE TEST STRIP: 75 days supply | Qty: 300 | Fill #1

## 2018-01-25 MED FILL — HumaLOG 100 UNIT/ML SOLN: 100 | 74 days supply | Qty: 60 | Fill #3

## 2018-02-01 DIAGNOSIS — E109 Type 1 diabetes mellitus without complications: Secondary | ICD-10-CM | POA: Diagnosis not present

## 2018-02-20 DIAGNOSIS — E109 Type 1 diabetes mellitus without complications: Secondary | ICD-10-CM | POA: Diagnosis not present

## 2018-04-25 MED FILL — FREESTYLE LITE TEST STRIP: 90 days supply | Qty: 400 | Fill #0

## 2018-04-25 MED FILL — HumaLOG 100 UNIT/ML SOLN: 100 | 90 days supply | Qty: 120 | Fill #0

## 2018-04-25 MED FILL — LEVOTHYROXINE 137 MCG TAB: 137 | 90 days supply | Qty: 90 | Fill #1

## 2018-05-02 DIAGNOSIS — E109 Type 1 diabetes mellitus without complications: Secondary | ICD-10-CM | POA: Diagnosis not present

## 2018-05-04 DIAGNOSIS — E1065 Type 1 diabetes mellitus with hyperglycemia: Secondary | ICD-10-CM | POA: Diagnosis not present

## 2018-05-04 DIAGNOSIS — E7849 Other hyperlipidemia: Secondary | ICD-10-CM | POA: Diagnosis not present

## 2018-05-04 DIAGNOSIS — E039 Hypothyroidism, unspecified: Secondary | ICD-10-CM | POA: Diagnosis not present

## 2018-06-01 DIAGNOSIS — E109 Type 1 diabetes mellitus without complications: Secondary | ICD-10-CM | POA: Diagnosis not present

## 2018-07-20 MED FILL — HumaLOG 100 UNIT/ML SOLN: 100 | 90 days supply | Qty: 120 | Fill #0

## 2018-07-20 MED FILL — FREESTYLE LITE TEST STRIP: 90 days supply | Qty: 400 | Fill #0

## 2018-07-20 MED FILL — LEVOTHYROXINE 137 MCG TABLE: 137 | 90 days supply | Qty: 90 | Fill #0

## 2018-08-10 DIAGNOSIS — E109 Type 1 diabetes mellitus without complications: Secondary | ICD-10-CM | POA: Diagnosis not present

## 2018-09-11 DIAGNOSIS — E109 Type 1 diabetes mellitus without complications: Secondary | ICD-10-CM | POA: Diagnosis not present

## 2018-09-12 DIAGNOSIS — E10319 Type 1 diabetes mellitus with unspecified diabetic retinopathy without macular edema: Secondary | ICD-10-CM | POA: Diagnosis not present

## 2018-09-12 DIAGNOSIS — E1065 Type 1 diabetes mellitus with hyperglycemia: Secondary | ICD-10-CM | POA: Diagnosis not present

## 2018-09-12 DIAGNOSIS — Z794 Long term (current) use of insulin: Secondary | ICD-10-CM | POA: Diagnosis not present

## 2018-09-12 DIAGNOSIS — E785 Hyperlipidemia, unspecified: Secondary | ICD-10-CM | POA: Diagnosis not present

## 2018-09-12 DIAGNOSIS — E039 Hypothyroidism, unspecified: Secondary | ICD-10-CM | POA: Diagnosis not present

## 2018-09-12 DIAGNOSIS — E669 Obesity, unspecified: Secondary | ICD-10-CM | POA: Diagnosis not present

## 2018-10-26 MED FILL — FREESTYLE LITE TEST STRIP: 90 days supply | Qty: 400 | Fill #0

## 2018-10-26 MED FILL — HumaLOG 100 UNIT/ML SOLN: 100 | 90 days supply | Qty: 120 | Fill #0

## 2018-10-26 MED FILL — LEVOTHYROXINE 137 MCG TAB: 137 | 90 days supply | Qty: 90 | Fill #0

## 2018-11-07 DIAGNOSIS — E109 Type 1 diabetes mellitus without complications: Secondary | ICD-10-CM | POA: Diagnosis not present

## 2018-12-14 DIAGNOSIS — E109 Type 1 diabetes mellitus without complications: Secondary | ICD-10-CM | POA: Diagnosis not present

## 2019-01-12 DIAGNOSIS — E1065 Type 1 diabetes mellitus with hyperglycemia: Secondary | ICD-10-CM | POA: Diagnosis not present

## 2019-01-12 DIAGNOSIS — Z1339 Encounter for screening examination for other mental health and behavioral disorders: Secondary | ICD-10-CM | POA: Diagnosis not present

## 2019-01-12 DIAGNOSIS — E785 Hyperlipidemia, unspecified: Secondary | ICD-10-CM | POA: Diagnosis not present

## 2019-01-12 DIAGNOSIS — E039 Hypothyroidism, unspecified: Secondary | ICD-10-CM | POA: Diagnosis not present

## 2019-01-12 DIAGNOSIS — Z794 Long term (current) use of insulin: Secondary | ICD-10-CM | POA: Diagnosis not present

## 2019-01-12 DIAGNOSIS — Z1331 Encounter for screening for depression: Secondary | ICD-10-CM | POA: Diagnosis not present

## 2019-01-12 DIAGNOSIS — E669 Obesity, unspecified: Secondary | ICD-10-CM | POA: Diagnosis not present

## 2019-01-15 MED FILL — BAQSIMI ONE PACK 3 MG/DOSE: 3 | 14 days supply | Qty: 1 | Fill #0

## 2019-01-17 MED FILL — LEVOTHYROXINE 150 MCG TAB: 150 | 90 days supply | Qty: 90 | Fill #0

## 2019-01-31 IMAGING — MR MR HEAD WO/W CM
11 of 13 series · 37 of 48 positions shown · IV contrast (Yes)
Comparison: None.

CLINICAL DATA: Acute intractable headache. Paresthesia. Left facial
numbness and tingling

EXAM:
MRI HEAD WITHOUT AND WITH CONTRAST
TECHNIQUE: Multiplanar, multiecho pulse sequences of the brain and surrounding
structures were obtained without and with intravenous contrast.
CONTRAST:  20mL MULTIHANCE GADOBENATE DIMEGLUMINE 529 MG/ML IV SOLN

[Series 3: T1 · sagittal · 5.0mm · 0.47mm/px · 1 of 24 slices shown]
[im 1/24]
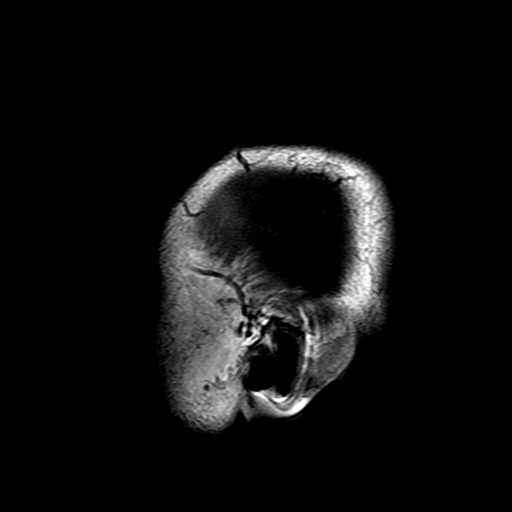

[Series 4: DWI · axial · 3.0mm · 1.09mm/px · z∈[-55,+95]mm · 9 of 104 slices shown (1 of 4)]
[im 1/104]
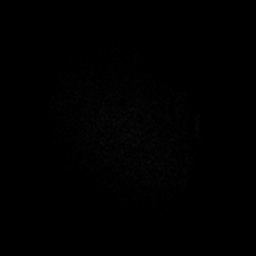
[im 13/104]
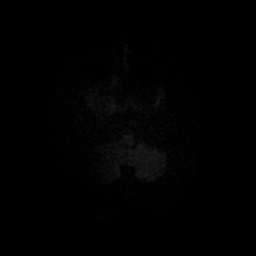
[im 26/104]
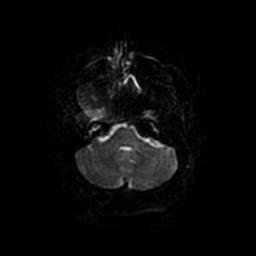
[im 39/104]
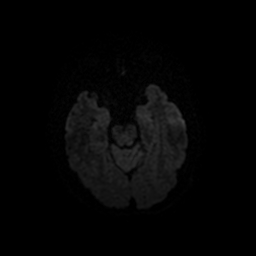
[im 52/104]
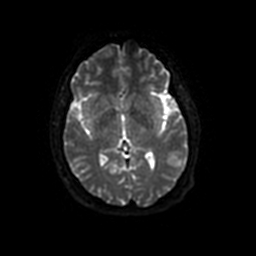
[im 65/104]
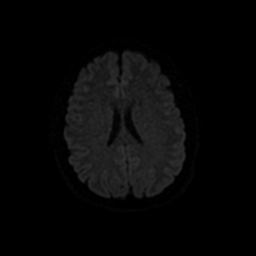
[im 78/104]
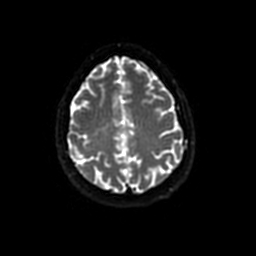
[im 91/104]
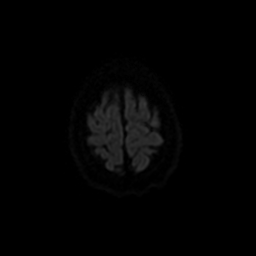
[im 104/104]
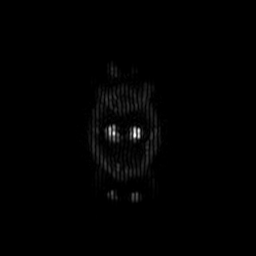

[Series 5: T2 · axial · 5.0mm · 0.43mm/px · z∈[-58,+93]mm · 2 of 23 slices shown]
[im 1/23]
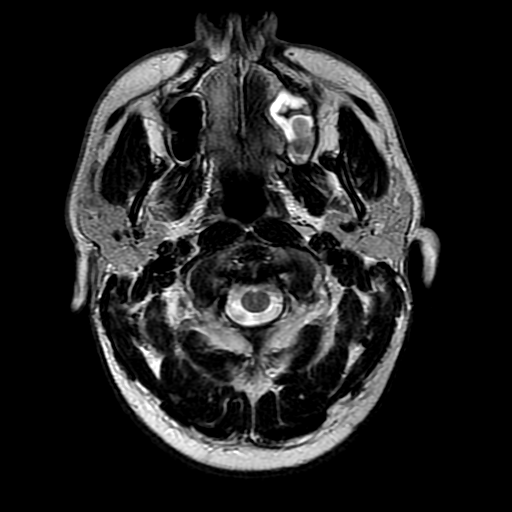
[im 23/23]
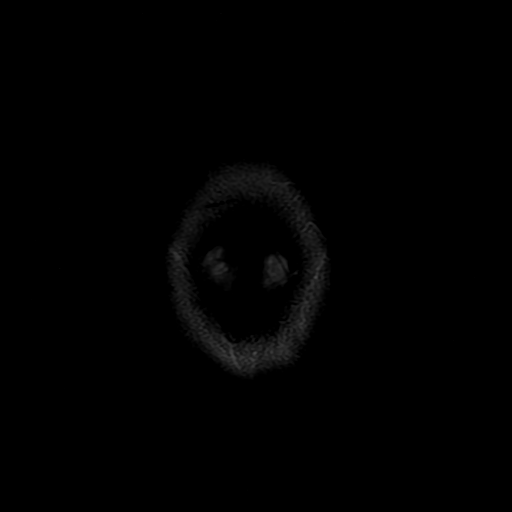

[Series 6: FLAIR · axial · 5.0mm · 0.43mm/px · z∈[-58,+93]mm · 2 of 23 slices shown]
[im 1/23]
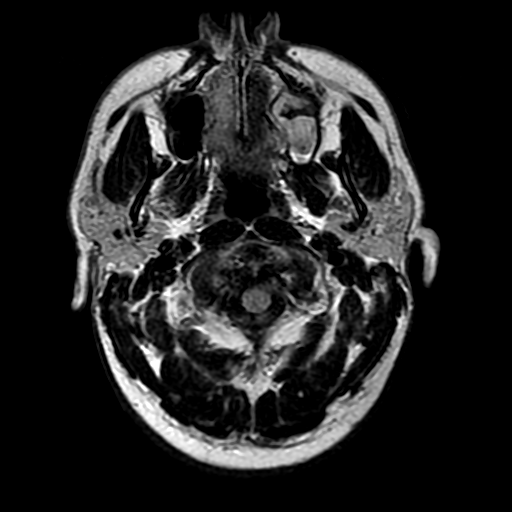
[im 23/23]
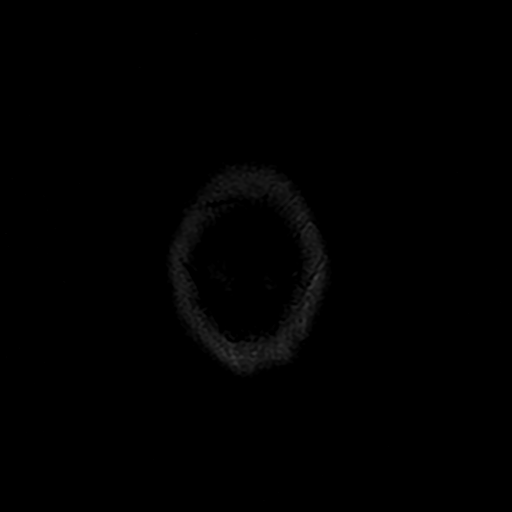

[Series 7: DWI · coronal · 5.0mm · 1.09mm/px · 7 of 74 slices shown (2 of 4)]
[im 1/74]
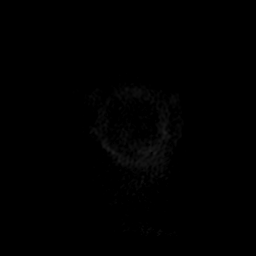
[im 13/74]
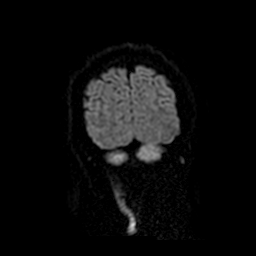
[im 25/74]
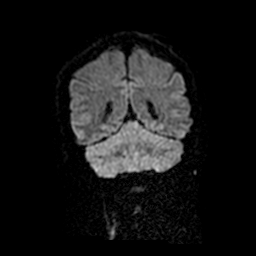
[im 37/74]
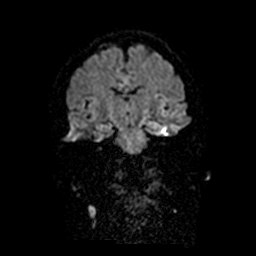
[im 49/74]
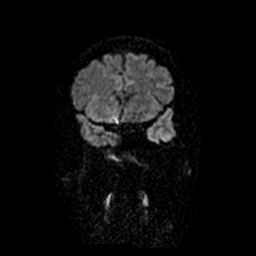
[im 61/74]
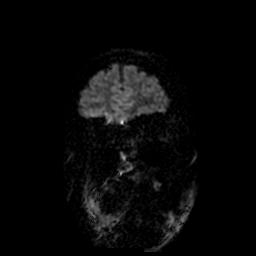
[im 74/74]
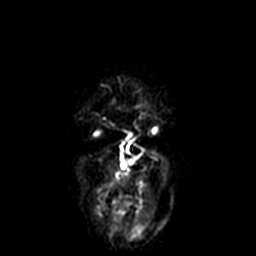

[Series 8: ax mpgr · axial · 5.0mm · 0.43mm/px · 1 of 23 slices shown]
[im 1/23]
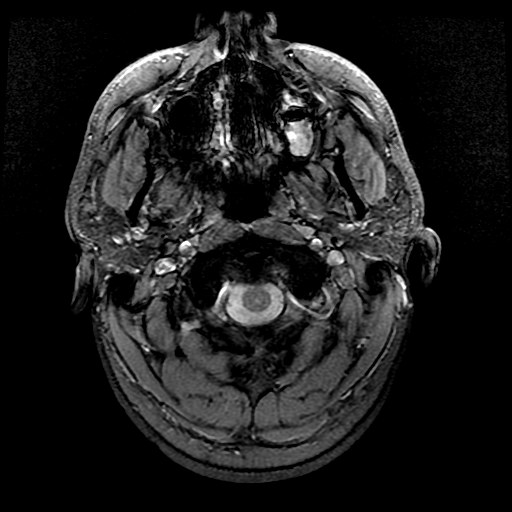

[Series 10: T2 post-contrast · coronal · 5.0mm · 0.90mm/px · 3 of 29 slices shown]
[im 1/29]
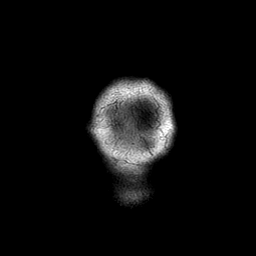
[im 15/29]
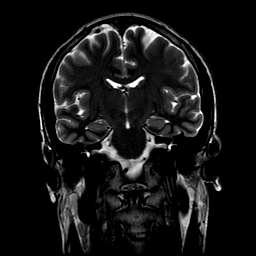
[im 29/29]
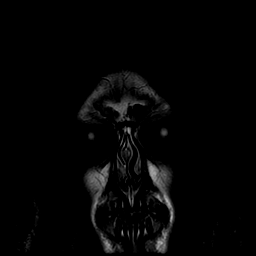

[Series 12: T1 post-contrast · coronal · 5.0mm · 0.45mm/px · 3 of 29 slices shown (1 of 2)]
[im 1/29]
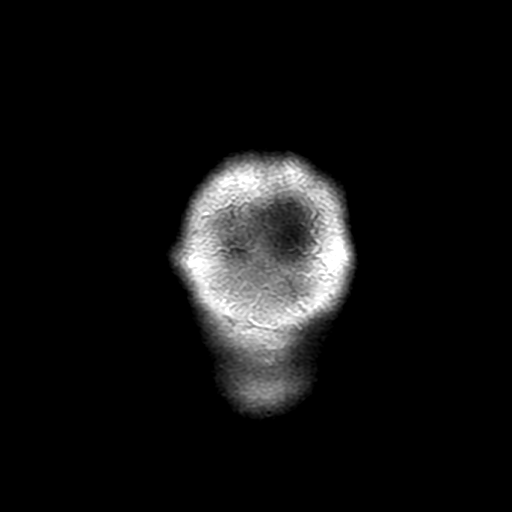
[im 15/29]
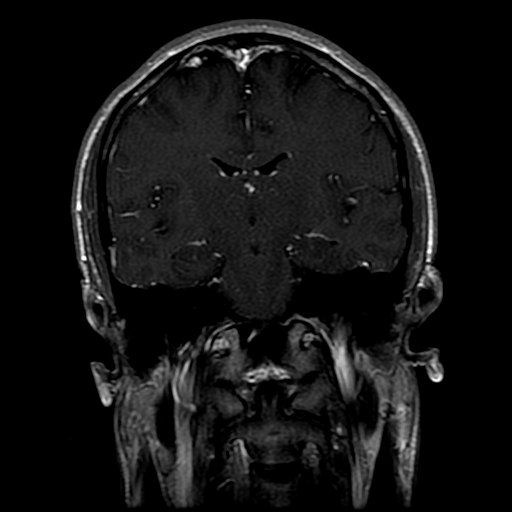
[im 29/29]
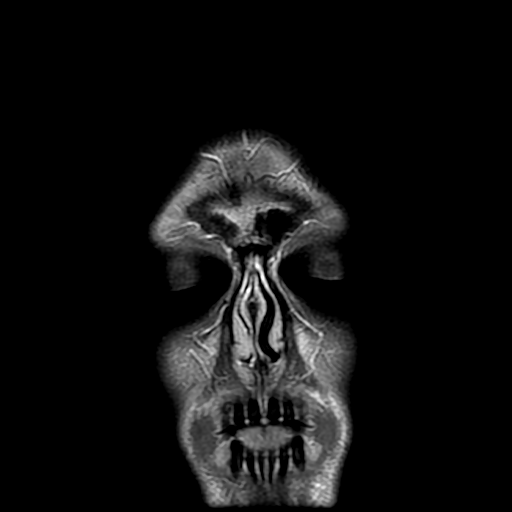

[Series 13: T1 post-contrast · sagittal · 5.0mm · 0.47mm/px · 2 of 24 slices shown (2 of 2)]
[im 1/24]
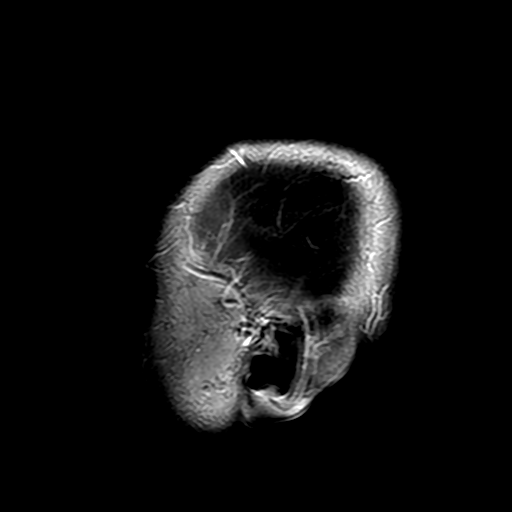
[im 24/24]
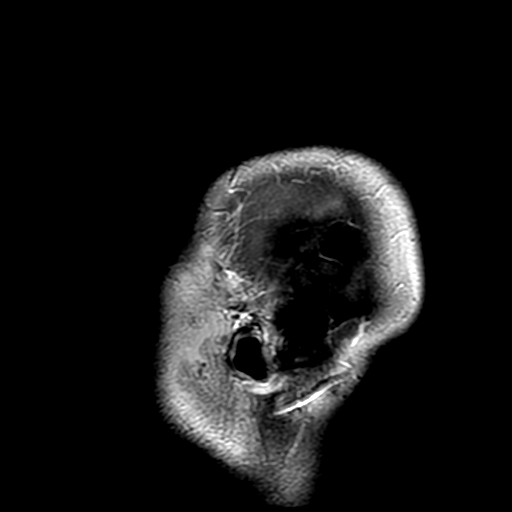

[Series 400: DWI · axial · 3.0mm · 1.09mm/px · z∈[-55,+95]mm · 4 of 50 slices shown (3 of 4)]
[im 1/50]
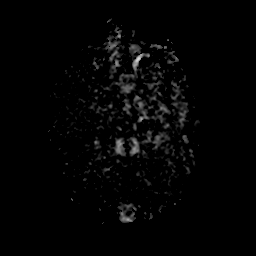
[im 17/50]
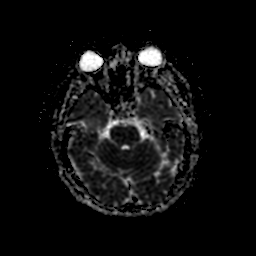
[im 33/50]
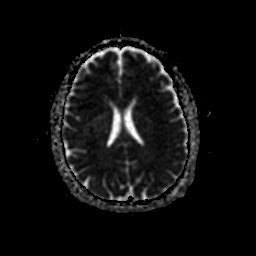
[im 50/50]
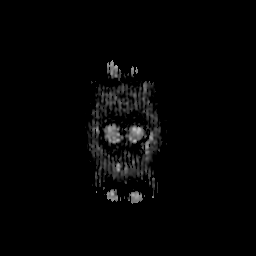

[Series 700: DWI · coronal · 5.0mm · 1.09mm/px · 3 of 37 slices shown (4 of 4)]
[im 1/37]
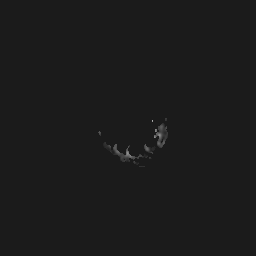
[im 19/37]
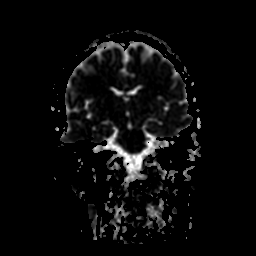
[im 37/37]
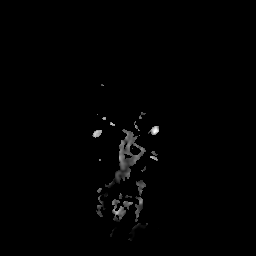

[37 of 48 positions shown; findings below may reference images not displayed]

FINDINGS: Brain: No acute infarction, hemorrhage, hydrocephalus, extra-axial
collection or mass lesion. Normal enhancement following contrast
infusion.

Vascular: Normal arterial flow voids.  Normal venous enhancement.

Skull and upper cervical spine: Negative

Sinuses/Orbits: Mucosal edema paranasal sinuses.  Normal orbit.

Other: None
IMPRESSION: Normal MRI head with contrast.

Mild mucosal edema paranasal sinuses.

## 2019-02-01 MED FILL — FREESTYLE LITE TEST STRIP: 90 days supply | Qty: 400 | Fill #1

## 2019-02-01 MED FILL — HumaLOG 100 UNIT/ML SOLN: 100 | 90 days supply | Qty: 110 | Fill #1

## 2019-02-13 DIAGNOSIS — E7849 Other hyperlipidemia: Secondary | ICD-10-CM | POA: Diagnosis not present

## 2019-02-15 DIAGNOSIS — E109 Type 1 diabetes mellitus without complications: Secondary | ICD-10-CM | POA: Diagnosis not present

## 2019-03-13 DIAGNOSIS — E109 Type 1 diabetes mellitus without complications: Secondary | ICD-10-CM | POA: Diagnosis not present

## 2019-04-19 DIAGNOSIS — E119 Type 2 diabetes mellitus without complications: Secondary | ICD-10-CM | POA: Diagnosis not present

## 2019-05-08 DIAGNOSIS — E1065 Type 1 diabetes mellitus with hyperglycemia: Secondary | ICD-10-CM | POA: Diagnosis not present

## 2019-05-09 DIAGNOSIS — E785 Hyperlipidemia, unspecified: Secondary | ICD-10-CM | POA: Diagnosis not present

## 2019-05-09 DIAGNOSIS — E039 Hypothyroidism, unspecified: Secondary | ICD-10-CM | POA: Diagnosis not present

## 2019-05-09 DIAGNOSIS — E669 Obesity, unspecified: Secondary | ICD-10-CM | POA: Diagnosis not present

## 2019-05-09 DIAGNOSIS — Z794 Long term (current) use of insulin: Secondary | ICD-10-CM | POA: Diagnosis not present

## 2019-05-09 DIAGNOSIS — E1065 Type 1 diabetes mellitus with hyperglycemia: Secondary | ICD-10-CM | POA: Diagnosis not present

## 2019-05-28 DIAGNOSIS — E109 Type 1 diabetes mellitus without complications: Secondary | ICD-10-CM | POA: Diagnosis not present

## 2019-06-06 ENCOUNTER — Other Ambulatory Visit (HOSPITAL_COMMUNITY): Payer: Self-pay | Admitting: Endocrinology

## 2019-06-06 MED FILL — FREESTYLE LITE TEST STRIP: 87 days supply | Qty: 350 | Fill #0

## 2019-06-06 MED FILL — HumaLOG 100 UNIT/ML SOLN: 100 | 90 days supply | Qty: 110 | Fill #0

## 2019-06-08 ENCOUNTER — Other Ambulatory Visit (HOSPITAL_COMMUNITY): Payer: Self-pay | Admitting: Endocrinology

## 2019-06-08 MED FILL — LEVOTHYROXINE SODIUM 150 MC: 150 | 90 days supply | Qty: 90 | Fill #1

## 2019-07-30 DIAGNOSIS — E038 Other specified hypothyroidism: Secondary | ICD-10-CM | POA: Diagnosis not present

## 2019-07-30 DIAGNOSIS — Z794 Long term (current) use of insulin: Secondary | ICD-10-CM | POA: Diagnosis not present

## 2019-07-30 DIAGNOSIS — Z0000 Encounter for general adult medical examination without abnormal findings: Secondary | ICD-10-CM | POA: Diagnosis not present

## 2019-07-30 DIAGNOSIS — E7849 Other hyperlipidemia: Secondary | ICD-10-CM | POA: Diagnosis not present

## 2019-07-30 DIAGNOSIS — E669 Obesity, unspecified: Secondary | ICD-10-CM | POA: Diagnosis not present

## 2019-07-30 DIAGNOSIS — E1065 Type 1 diabetes mellitus with hyperglycemia: Secondary | ICD-10-CM | POA: Diagnosis not present

## 2019-09-06 DIAGNOSIS — E109 Type 1 diabetes mellitus without complications: Secondary | ICD-10-CM | POA: Diagnosis not present

## 2019-09-12 DIAGNOSIS — E109 Type 1 diabetes mellitus without complications: Secondary | ICD-10-CM | POA: Diagnosis not present

## 2019-09-26 MED FILL — HumaLOG 100 UNIT/ML SOLN: 100 | 90 days supply | Qty: 110 | Fill #1

## 2019-09-26 MED FILL — FREESTYLE LITE TEST STRIP: 87 days supply | Qty: 350 | Fill #1

## 2019-12-14 DIAGNOSIS — E1065 Type 1 diabetes mellitus with hyperglycemia: Secondary | ICD-10-CM | POA: Diagnosis not present

## 2019-12-14 DIAGNOSIS — E109 Type 1 diabetes mellitus without complications: Secondary | ICD-10-CM | POA: Diagnosis not present

## 2020-01-11 DIAGNOSIS — E039 Hypothyroidism, unspecified: Secondary | ICD-10-CM | POA: Diagnosis not present

## 2020-01-11 DIAGNOSIS — E785 Hyperlipidemia, unspecified: Secondary | ICD-10-CM | POA: Diagnosis not present

## 2020-01-11 DIAGNOSIS — E1065 Type 1 diabetes mellitus with hyperglycemia: Secondary | ICD-10-CM | POA: Diagnosis not present

## 2020-01-11 DIAGNOSIS — E669 Obesity, unspecified: Secondary | ICD-10-CM | POA: Diagnosis not present

## 2020-01-11 DIAGNOSIS — Z794 Long term (current) use of insulin: Secondary | ICD-10-CM | POA: Diagnosis not present

## 2020-01-11 DIAGNOSIS — Z4681 Encounter for fitting and adjustment of insulin pump: Secondary | ICD-10-CM | POA: Diagnosis not present

## 2020-01-30 MED FILL — LEVOTHYROXINE SODIUM 150 MC: 150 | 90 days supply | Qty: 90 | Fill #0

## 2020-01-30 MED FILL — HumaLOG 100 UNIT/ML SOLN: 100 | 90 days supply | Qty: 110 | Fill #2

## 2020-01-30 MED FILL — FREESTYLE LITE TEST STRIP: 87 days supply | Qty: 350 | Fill #2

## 2020-03-11 DIAGNOSIS — E1065 Type 1 diabetes mellitus with hyperglycemia: Secondary | ICD-10-CM | POA: Diagnosis not present

## 2020-03-11 DIAGNOSIS — E109 Type 1 diabetes mellitus without complications: Secondary | ICD-10-CM | POA: Diagnosis not present

## 2020-03-14 DIAGNOSIS — E1065 Type 1 diabetes mellitus with hyperglycemia: Secondary | ICD-10-CM | POA: Diagnosis not present

## 2020-03-14 DIAGNOSIS — E109 Type 1 diabetes mellitus without complications: Secondary | ICD-10-CM | POA: Diagnosis not present

## 2020-05-06 DIAGNOSIS — E1065 Type 1 diabetes mellitus with hyperglycemia: Secondary | ICD-10-CM | POA: Diagnosis not present

## 2020-05-06 DIAGNOSIS — Z794 Long term (current) use of insulin: Secondary | ICD-10-CM | POA: Diagnosis not present

## 2020-05-06 DIAGNOSIS — E669 Obesity, unspecified: Secondary | ICD-10-CM | POA: Diagnosis not present

## 2020-05-06 DIAGNOSIS — Z4681 Encounter for fitting and adjustment of insulin pump: Secondary | ICD-10-CM | POA: Diagnosis not present

## 2020-05-06 DIAGNOSIS — E039 Hypothyroidism, unspecified: Secondary | ICD-10-CM | POA: Diagnosis not present

## 2020-05-06 DIAGNOSIS — E785 Hyperlipidemia, unspecified: Secondary | ICD-10-CM | POA: Diagnosis not present

## 2020-06-04 MED FILL — HumaLOG 100 UNIT/ML SOLN: 100 | 90 days supply | Qty: 110 | Fill #3

## 2020-06-04 MED FILL — LEVOTHYROXINE SODIUM 150 MC: 150 | 90 days supply | Qty: 90 | Fill #1

## 2020-06-04 MED FILL — FREESTYLE LITE TEST STRIP: 87 days supply | Qty: 350 | Fill #3

## 2020-06-12 DIAGNOSIS — E109 Type 1 diabetes mellitus without complications: Secondary | ICD-10-CM | POA: Diagnosis not present

## 2020-06-12 DIAGNOSIS — E1065 Type 1 diabetes mellitus with hyperglycemia: Secondary | ICD-10-CM | POA: Diagnosis not present

## 2020-09-10 DIAGNOSIS — E1065 Type 1 diabetes mellitus with hyperglycemia: Secondary | ICD-10-CM | POA: Diagnosis not present

## 2020-09-10 DIAGNOSIS — E109 Type 1 diabetes mellitus without complications: Secondary | ICD-10-CM | POA: Diagnosis not present

## 2020-09-18 DIAGNOSIS — E119 Type 2 diabetes mellitus without complications: Secondary | ICD-10-CM | POA: Diagnosis not present

## 2020-09-19 ENCOUNTER — Other Ambulatory Visit (HOSPITAL_COMMUNITY): Payer: Self-pay

## 2020-09-23 ENCOUNTER — Other Ambulatory Visit (HOSPITAL_COMMUNITY): Payer: Self-pay

## 2020-09-29 ENCOUNTER — Other Ambulatory Visit (HOSPITAL_COMMUNITY): Payer: Self-pay

## 2020-09-29 MED ORDER — LEVOTHYROXINE SODIUM 150 MCG PO TABS
150.0000 ug | ORAL_TABLET | Freq: Every day | ORAL | 3 refills | Status: AC
  Filled 2020-09-29: qty 90, 90d supply, fill #0
  Filled 2021-05-04: qty 90, 90d supply, fill #1
  Filled 2021-09-09: qty 90, 90d supply, fill #2

## 2020-09-29 MED ORDER — INSULIN LISPRO 100 UNIT/ML IJ SOLN
120.0000 [IU] | INTRAMUSCULAR | 3 refills | Status: AC
  Filled 2020-09-29: qty 110, 90d supply, fill #0

## 2020-09-29 MED ORDER — GLUCOSE BLOOD VI STRP
ORAL_STRIP | 0 refills | Status: AC
  Filled 2020-09-29: qty 350, 87d supply, fill #0

## 2020-10-08 DIAGNOSIS — E1065 Type 1 diabetes mellitus with hyperglycemia: Secondary | ICD-10-CM | POA: Diagnosis not present

## 2020-10-08 DIAGNOSIS — E109 Type 1 diabetes mellitus without complications: Secondary | ICD-10-CM | POA: Diagnosis not present

## 2020-11-03 DIAGNOSIS — E039 Hypothyroidism, unspecified: Secondary | ICD-10-CM | POA: Diagnosis not present

## 2020-11-03 DIAGNOSIS — Z794 Long term (current) use of insulin: Secondary | ICD-10-CM | POA: Diagnosis not present

## 2020-11-03 DIAGNOSIS — Z4681 Encounter for fitting and adjustment of insulin pump: Secondary | ICD-10-CM | POA: Diagnosis not present

## 2020-11-03 DIAGNOSIS — R519 Headache, unspecified: Secondary | ICD-10-CM | POA: Diagnosis not present

## 2020-11-03 DIAGNOSIS — E669 Obesity, unspecified: Secondary | ICD-10-CM | POA: Diagnosis not present

## 2020-11-03 DIAGNOSIS — E785 Hyperlipidemia, unspecified: Secondary | ICD-10-CM | POA: Diagnosis not present

## 2020-11-03 DIAGNOSIS — E1065 Type 1 diabetes mellitus with hyperglycemia: Secondary | ICD-10-CM | POA: Diagnosis not present

## 2020-12-10 DIAGNOSIS — E1065 Type 1 diabetes mellitus with hyperglycemia: Secondary | ICD-10-CM | POA: Diagnosis not present

## 2020-12-10 DIAGNOSIS — E109 Type 1 diabetes mellitus without complications: Secondary | ICD-10-CM | POA: Diagnosis not present

## 2020-12-30 DIAGNOSIS — E1065 Type 1 diabetes mellitus with hyperglycemia: Secondary | ICD-10-CM | POA: Diagnosis not present

## 2020-12-30 DIAGNOSIS — E109 Type 1 diabetes mellitus without complications: Secondary | ICD-10-CM | POA: Diagnosis not present

## 2021-01-19 ENCOUNTER — Other Ambulatory Visit (HOSPITAL_COMMUNITY): Payer: Self-pay

## 2021-01-19 MED ORDER — GLUCOSE BLOOD VI STRP
ORAL_STRIP | Freq: Four times a day (QID) | 3 refills | Status: AC
  Filled 2021-01-19: qty 350, 87d supply, fill #0
  Filled 2021-05-04: qty 350, 87d supply, fill #1
  Filled 2021-09-09: qty 350, 87d supply, fill #2

## 2021-01-19 MED ORDER — INSULIN LISPRO 100 UNIT/ML IJ SOLN
120.0000 [IU] | Freq: Every day | INTRAMUSCULAR | 3 refills | Status: AC
  Filled 2021-01-19: qty 100, 83d supply, fill #0
  Filled 2021-05-04: qty 100, 83d supply, fill #1
  Filled 2021-09-09: qty 100, 83d supply, fill #2

## 2021-01-20 ENCOUNTER — Other Ambulatory Visit (HOSPITAL_COMMUNITY): Payer: Self-pay

## 2021-03-11 DIAGNOSIS — E109 Type 1 diabetes mellitus without complications: Secondary | ICD-10-CM | POA: Diagnosis not present

## 2021-03-11 DIAGNOSIS — E1065 Type 1 diabetes mellitus with hyperglycemia: Secondary | ICD-10-CM | POA: Diagnosis not present

## 2021-04-01 DIAGNOSIS — E109 Type 1 diabetes mellitus without complications: Secondary | ICD-10-CM | POA: Diagnosis not present

## 2021-04-01 DIAGNOSIS — E1065 Type 1 diabetes mellitus with hyperglycemia: Secondary | ICD-10-CM | POA: Diagnosis not present

## 2021-04-02 DIAGNOSIS — Z4681 Encounter for fitting and adjustment of insulin pump: Secondary | ICD-10-CM | POA: Diagnosis not present

## 2021-04-02 DIAGNOSIS — Z794 Long term (current) use of insulin: Secondary | ICD-10-CM | POA: Diagnosis not present

## 2021-04-02 DIAGNOSIS — R519 Headache, unspecified: Secondary | ICD-10-CM | POA: Diagnosis not present

## 2021-04-02 DIAGNOSIS — E039 Hypothyroidism, unspecified: Secondary | ICD-10-CM | POA: Diagnosis not present

## 2021-04-02 DIAGNOSIS — Z0000 Encounter for general adult medical examination without abnormal findings: Secondary | ICD-10-CM | POA: Diagnosis not present

## 2021-04-02 DIAGNOSIS — E1065 Type 1 diabetes mellitus with hyperglycemia: Secondary | ICD-10-CM | POA: Diagnosis not present

## 2021-04-02 DIAGNOSIS — E669 Obesity, unspecified: Secondary | ICD-10-CM | POA: Diagnosis not present

## 2021-04-02 DIAGNOSIS — E785 Hyperlipidemia, unspecified: Secondary | ICD-10-CM | POA: Diagnosis not present

## 2021-05-04 ENCOUNTER — Other Ambulatory Visit (HOSPITAL_COMMUNITY): Payer: Self-pay

## 2021-06-19 DIAGNOSIS — E1065 Type 1 diabetes mellitus with hyperglycemia: Secondary | ICD-10-CM | POA: Diagnosis not present

## 2021-06-19 DIAGNOSIS — E109 Type 1 diabetes mellitus without complications: Secondary | ICD-10-CM | POA: Diagnosis not present

## 2021-07-03 DIAGNOSIS — E1065 Type 1 diabetes mellitus with hyperglycemia: Secondary | ICD-10-CM | POA: Diagnosis not present

## 2021-07-03 DIAGNOSIS — E109 Type 1 diabetes mellitus without complications: Secondary | ICD-10-CM | POA: Diagnosis not present

## 2021-09-09 ENCOUNTER — Other Ambulatory Visit (HOSPITAL_COMMUNITY): Payer: Self-pay

## 2021-09-10 ENCOUNTER — Other Ambulatory Visit (HOSPITAL_COMMUNITY): Payer: Self-pay

## 2021-09-16 ENCOUNTER — Other Ambulatory Visit (HOSPITAL_COMMUNITY): Payer: Self-pay

## 2021-09-17 DIAGNOSIS — E1065 Type 1 diabetes mellitus with hyperglycemia: Secondary | ICD-10-CM | POA: Diagnosis not present

## 2021-09-17 DIAGNOSIS — E109 Type 1 diabetes mellitus without complications: Secondary | ICD-10-CM | POA: Diagnosis not present

## 2021-10-05 DIAGNOSIS — E1065 Type 1 diabetes mellitus with hyperglycemia: Secondary | ICD-10-CM | POA: Diagnosis not present

## 2021-10-05 DIAGNOSIS — E109 Type 1 diabetes mellitus without complications: Secondary | ICD-10-CM | POA: Diagnosis not present

## 2021-10-29 DIAGNOSIS — E1065 Type 1 diabetes mellitus with hyperglycemia: Secondary | ICD-10-CM | POA: Diagnosis not present

## 2021-10-29 DIAGNOSIS — Z4681 Encounter for fitting and adjustment of insulin pump: Secondary | ICD-10-CM | POA: Diagnosis not present

## 2021-10-29 DIAGNOSIS — E669 Obesity, unspecified: Secondary | ICD-10-CM | POA: Diagnosis not present

## 2021-10-29 DIAGNOSIS — E039 Hypothyroidism, unspecified: Secondary | ICD-10-CM | POA: Diagnosis not present

## 2021-10-29 DIAGNOSIS — Z794 Long term (current) use of insulin: Secondary | ICD-10-CM | POA: Diagnosis not present

## 2021-10-29 DIAGNOSIS — E785 Hyperlipidemia, unspecified: Secondary | ICD-10-CM | POA: Diagnosis not present

## 2021-12-24 DIAGNOSIS — E1065 Type 1 diabetes mellitus with hyperglycemia: Secondary | ICD-10-CM | POA: Diagnosis not present

## 2021-12-24 DIAGNOSIS — E109 Type 1 diabetes mellitus without complications: Secondary | ICD-10-CM | POA: Diagnosis not present

## 2021-12-28 DIAGNOSIS — E109 Type 1 diabetes mellitus without complications: Secondary | ICD-10-CM | POA: Diagnosis not present

## 2021-12-28 DIAGNOSIS — E1065 Type 1 diabetes mellitus with hyperglycemia: Secondary | ICD-10-CM | POA: Diagnosis not present

## 2022-01-05 DIAGNOSIS — E1065 Type 1 diabetes mellitus with hyperglycemia: Secondary | ICD-10-CM | POA: Diagnosis not present

## 2022-01-05 DIAGNOSIS — E109 Type 1 diabetes mellitus without complications: Secondary | ICD-10-CM | POA: Diagnosis not present

## 2022-01-11 DIAGNOSIS — E119 Type 2 diabetes mellitus without complications: Secondary | ICD-10-CM | POA: Diagnosis not present

## 2022-01-22 DIAGNOSIS — Z794 Long term (current) use of insulin: Secondary | ICD-10-CM | POA: Diagnosis not present

## 2022-01-22 DIAGNOSIS — Z4681 Encounter for fitting and adjustment of insulin pump: Secondary | ICD-10-CM | POA: Diagnosis not present

## 2022-01-22 DIAGNOSIS — E785 Hyperlipidemia, unspecified: Secondary | ICD-10-CM | POA: Diagnosis not present

## 2022-01-22 DIAGNOSIS — E1065 Type 1 diabetes mellitus with hyperglycemia: Secondary | ICD-10-CM | POA: Diagnosis not present

## 2022-02-02 ENCOUNTER — Other Ambulatory Visit (HOSPITAL_COMMUNITY): Payer: Self-pay

## 2022-02-02 MED ORDER — INSULIN LISPRO 100 UNIT/ML IJ SOLN
120.0000 [IU] | Freq: Every day | INTRAMUSCULAR | 3 refills | Status: AC
  Filled 2022-02-02: qty 100, 83d supply, fill #0
  Filled 2022-06-16: qty 100, 83d supply, fill #1
  Filled 2022-10-13 – 2022-10-14 (×2): qty 100, 83d supply, fill #2
  Filled 2023-01-18: qty 100, 83d supply, fill #3

## 2022-03-23 DIAGNOSIS — Z794 Long term (current) use of insulin: Secondary | ICD-10-CM | POA: Diagnosis not present

## 2022-03-23 DIAGNOSIS — E039 Hypothyroidism, unspecified: Secondary | ICD-10-CM | POA: Diagnosis not present

## 2022-03-23 DIAGNOSIS — E785 Hyperlipidemia, unspecified: Secondary | ICD-10-CM | POA: Diagnosis not present

## 2022-03-23 DIAGNOSIS — E1065 Type 1 diabetes mellitus with hyperglycemia: Secondary | ICD-10-CM | POA: Diagnosis not present

## 2022-03-23 DIAGNOSIS — Z4681 Encounter for fitting and adjustment of insulin pump: Secondary | ICD-10-CM | POA: Diagnosis not present

## 2022-03-23 DIAGNOSIS — E669 Obesity, unspecified: Secondary | ICD-10-CM | POA: Diagnosis not present

## 2022-04-01 DIAGNOSIS — E1065 Type 1 diabetes mellitus with hyperglycemia: Secondary | ICD-10-CM | POA: Diagnosis not present

## 2022-04-01 DIAGNOSIS — E109 Type 1 diabetes mellitus without complications: Secondary | ICD-10-CM | POA: Diagnosis not present

## 2022-04-07 DIAGNOSIS — E109 Type 1 diabetes mellitus without complications: Secondary | ICD-10-CM | POA: Diagnosis not present

## 2022-04-07 DIAGNOSIS — E1065 Type 1 diabetes mellitus with hyperglycemia: Secondary | ICD-10-CM | POA: Diagnosis not present

## 2022-05-31 ENCOUNTER — Other Ambulatory Visit (HOSPITAL_COMMUNITY): Payer: Self-pay

## 2022-06-16 ENCOUNTER — Other Ambulatory Visit (HOSPITAL_COMMUNITY): Payer: Self-pay

## 2022-06-16 MED ORDER — LEVOTHYROXINE SODIUM 150 MCG PO TABS
150.0000 ug | ORAL_TABLET | Freq: Every day | ORAL | 3 refills | Status: AC
  Filled 2022-06-16: qty 90, 90d supply, fill #0
  Filled 2022-10-13 – 2022-10-14 (×2): qty 90, 90d supply, fill #1

## 2022-06-17 ENCOUNTER — Other Ambulatory Visit (HOSPITAL_COMMUNITY): Payer: Self-pay

## 2022-06-30 DIAGNOSIS — E109 Type 1 diabetes mellitus without complications: Secondary | ICD-10-CM | POA: Diagnosis not present

## 2022-06-30 DIAGNOSIS — E1065 Type 1 diabetes mellitus with hyperglycemia: Secondary | ICD-10-CM | POA: Diagnosis not present

## 2022-07-06 DIAGNOSIS — E109 Type 1 diabetes mellitus without complications: Secondary | ICD-10-CM | POA: Diagnosis not present

## 2022-07-06 DIAGNOSIS — E1065 Type 1 diabetes mellitus with hyperglycemia: Secondary | ICD-10-CM | POA: Diagnosis not present

## 2022-07-16 DIAGNOSIS — E1065 Type 1 diabetes mellitus with hyperglycemia: Secondary | ICD-10-CM | POA: Diagnosis not present

## 2022-07-16 DIAGNOSIS — E109 Type 1 diabetes mellitus without complications: Secondary | ICD-10-CM | POA: Diagnosis not present

## 2022-07-26 ENCOUNTER — Other Ambulatory Visit (HOSPITAL_COMMUNITY): Payer: Self-pay

## 2022-07-26 DIAGNOSIS — E1065 Type 1 diabetes mellitus with hyperglycemia: Secondary | ICD-10-CM | POA: Diagnosis not present

## 2022-07-26 DIAGNOSIS — Z4681 Encounter for fitting and adjustment of insulin pump: Secondary | ICD-10-CM | POA: Diagnosis not present

## 2022-07-26 DIAGNOSIS — E669 Obesity, unspecified: Secondary | ICD-10-CM | POA: Diagnosis not present

## 2022-07-26 DIAGNOSIS — Z794 Long term (current) use of insulin: Secondary | ICD-10-CM | POA: Diagnosis not present

## 2022-07-26 DIAGNOSIS — E785 Hyperlipidemia, unspecified: Secondary | ICD-10-CM | POA: Diagnosis not present

## 2022-07-26 DIAGNOSIS — E039 Hypothyroidism, unspecified: Secondary | ICD-10-CM | POA: Diagnosis not present

## 2022-07-26 MED ORDER — LEVOTHYROXINE SODIUM 175 MCG PO TABS
175.0000 ug | ORAL_TABLET | Freq: Every day | ORAL | 3 refills | Status: AC
  Filled 2022-07-26: qty 90, 90d supply, fill #0
  Filled 2023-01-18: qty 90, 90d supply, fill #1

## 2022-09-29 DIAGNOSIS — E109 Type 1 diabetes mellitus without complications: Secondary | ICD-10-CM | POA: Diagnosis not present

## 2022-09-29 DIAGNOSIS — E1065 Type 1 diabetes mellitus with hyperglycemia: Secondary | ICD-10-CM | POA: Diagnosis not present

## 2022-10-04 DIAGNOSIS — E1065 Type 1 diabetes mellitus with hyperglycemia: Secondary | ICD-10-CM | POA: Diagnosis not present

## 2022-10-04 DIAGNOSIS — E109 Type 1 diabetes mellitus without complications: Secondary | ICD-10-CM | POA: Diagnosis not present

## 2022-10-14 ENCOUNTER — Other Ambulatory Visit: Payer: Self-pay

## 2022-10-14 ENCOUNTER — Other Ambulatory Visit (HOSPITAL_COMMUNITY): Payer: Self-pay

## 2022-10-16 ENCOUNTER — Other Ambulatory Visit (HOSPITAL_COMMUNITY): Payer: Self-pay

## 2022-10-18 ENCOUNTER — Other Ambulatory Visit (HOSPITAL_COMMUNITY): Payer: Self-pay

## 2022-10-25 DIAGNOSIS — E109 Type 1 diabetes mellitus without complications: Secondary | ICD-10-CM | POA: Diagnosis not present

## 2022-10-25 DIAGNOSIS — E1065 Type 1 diabetes mellitus with hyperglycemia: Secondary | ICD-10-CM | POA: Diagnosis not present

## 2022-10-27 ENCOUNTER — Other Ambulatory Visit: Payer: Self-pay

## 2022-11-16 ENCOUNTER — Other Ambulatory Visit (HOSPITAL_COMMUNITY): Payer: Self-pay

## 2022-12-15 DIAGNOSIS — E785 Hyperlipidemia, unspecified: Secondary | ICD-10-CM | POA: Diagnosis not present

## 2022-12-15 DIAGNOSIS — E1065 Type 1 diabetes mellitus with hyperglycemia: Secondary | ICD-10-CM | POA: Diagnosis not present

## 2022-12-15 DIAGNOSIS — E669 Obesity, unspecified: Secondary | ICD-10-CM | POA: Diagnosis not present

## 2022-12-15 DIAGNOSIS — Z794 Long term (current) use of insulin: Secondary | ICD-10-CM | POA: Diagnosis not present

## 2022-12-15 DIAGNOSIS — E039 Hypothyroidism, unspecified: Secondary | ICD-10-CM | POA: Diagnosis not present

## 2022-12-15 DIAGNOSIS — Z4681 Encounter for fitting and adjustment of insulin pump: Secondary | ICD-10-CM | POA: Diagnosis not present

## 2022-12-28 DIAGNOSIS — E1065 Type 1 diabetes mellitus with hyperglycemia: Secondary | ICD-10-CM | POA: Diagnosis not present

## 2022-12-28 DIAGNOSIS — E109 Type 1 diabetes mellitus without complications: Secondary | ICD-10-CM | POA: Diagnosis not present

## 2023-01-03 DIAGNOSIS — E109 Type 1 diabetes mellitus without complications: Secondary | ICD-10-CM | POA: Diagnosis not present

## 2023-01-03 DIAGNOSIS — E1065 Type 1 diabetes mellitus with hyperglycemia: Secondary | ICD-10-CM | POA: Diagnosis not present

## 2023-01-26 DIAGNOSIS — E119 Type 2 diabetes mellitus without complications: Secondary | ICD-10-CM | POA: Diagnosis not present

## 2023-02-02 DIAGNOSIS — E1065 Type 1 diabetes mellitus with hyperglycemia: Secondary | ICD-10-CM | POA: Diagnosis not present

## 2023-02-02 DIAGNOSIS — E109 Type 1 diabetes mellitus without complications: Secondary | ICD-10-CM | POA: Diagnosis not present

## 2023-02-21 ENCOUNTER — Telehealth: Payer: Commercial Managed Care - PPO | Admitting: Family Medicine

## 2023-02-21 ENCOUNTER — Other Ambulatory Visit (HOSPITAL_COMMUNITY): Payer: Self-pay

## 2023-02-21 DIAGNOSIS — J019 Acute sinusitis, unspecified: Secondary | ICD-10-CM

## 2023-02-21 DIAGNOSIS — B9689 Other specified bacterial agents as the cause of diseases classified elsewhere: Secondary | ICD-10-CM | POA: Diagnosis not present

## 2023-02-21 MED ORDER — AMOXICILLIN-POT CLAVULANATE 875-125 MG PO TABS
1.0000 | ORAL_TABLET | Freq: Two times a day (BID) | ORAL | 0 refills | Status: AC
  Filled 2023-02-21: qty 14, 7d supply, fill #0

## 2023-02-21 NOTE — Progress Notes (Signed)

## 2023-02-22 ENCOUNTER — Other Ambulatory Visit (HOSPITAL_COMMUNITY): Payer: Self-pay

## 2023-03-22 DIAGNOSIS — E1065 Type 1 diabetes mellitus with hyperglycemia: Secondary | ICD-10-CM | POA: Diagnosis not present

## 2023-03-22 DIAGNOSIS — E109 Type 1 diabetes mellitus without complications: Secondary | ICD-10-CM | POA: Diagnosis not present

## 2023-03-25 DIAGNOSIS — E109 Type 1 diabetes mellitus without complications: Secondary | ICD-10-CM | POA: Diagnosis not present

## 2023-03-25 DIAGNOSIS — E1065 Type 1 diabetes mellitus with hyperglycemia: Secondary | ICD-10-CM | POA: Diagnosis not present

## 2023-04-13 DIAGNOSIS — E785 Hyperlipidemia, unspecified: Secondary | ICD-10-CM | POA: Diagnosis not present

## 2023-04-13 DIAGNOSIS — E1065 Type 1 diabetes mellitus with hyperglycemia: Secondary | ICD-10-CM | POA: Diagnosis not present

## 2023-04-13 DIAGNOSIS — E039 Hypothyroidism, unspecified: Secondary | ICD-10-CM | POA: Diagnosis not present

## 2023-04-19 ENCOUNTER — Other Ambulatory Visit (HOSPITAL_COMMUNITY): Payer: Self-pay

## 2023-04-19 DIAGNOSIS — E1065 Type 1 diabetes mellitus with hyperglycemia: Secondary | ICD-10-CM | POA: Diagnosis not present

## 2023-04-19 DIAGNOSIS — E669 Obesity, unspecified: Secondary | ICD-10-CM | POA: Diagnosis not present

## 2023-04-19 DIAGNOSIS — R82998 Other abnormal findings in urine: Secondary | ICD-10-CM | POA: Diagnosis not present

## 2023-04-19 DIAGNOSIS — E785 Hyperlipidemia, unspecified: Secondary | ICD-10-CM | POA: Diagnosis not present

## 2023-04-19 DIAGNOSIS — Z4681 Encounter for fitting and adjustment of insulin pump: Secondary | ICD-10-CM | POA: Diagnosis not present

## 2023-04-19 DIAGNOSIS — Z0000 Encounter for general adult medical examination without abnormal findings: Secondary | ICD-10-CM | POA: Diagnosis not present

## 2023-04-19 DIAGNOSIS — E039 Hypothyroidism, unspecified: Secondary | ICD-10-CM | POA: Diagnosis not present

## 2023-04-19 MED ORDER — LEVOTHYROXINE SODIUM 200 MCG PO TABS
200.0000 ug | ORAL_TABLET | Freq: Every day | ORAL | 3 refills | Status: AC
  Filled 2023-04-19: qty 90, 90d supply, fill #0
  Filled 2023-08-15: qty 90, 90d supply, fill #1
  Filled 2024-01-02: qty 90, 90d supply, fill #2

## 2023-04-26 ENCOUNTER — Other Ambulatory Visit (HOSPITAL_COMMUNITY): Payer: Self-pay

## 2023-04-26 MED ORDER — INSULIN LISPRO 100 UNIT/ML IJ SOLN
120.0000 [IU] | Freq: Every day | INTRAMUSCULAR | 3 refills | Status: AC
  Filled 2023-04-26: qty 110, 90d supply, fill #0
  Filled 2023-08-15: qty 110, 90d supply, fill #1
  Filled 2024-01-02: qty 110, 90d supply, fill #2

## 2023-05-06 DIAGNOSIS — E109 Type 1 diabetes mellitus without complications: Secondary | ICD-10-CM | POA: Diagnosis not present

## 2023-05-06 DIAGNOSIS — E1065 Type 1 diabetes mellitus with hyperglycemia: Secondary | ICD-10-CM | POA: Diagnosis not present

## 2023-06-14 DIAGNOSIS — E109 Type 1 diabetes mellitus without complications: Secondary | ICD-10-CM | POA: Diagnosis not present

## 2023-06-14 DIAGNOSIS — E1065 Type 1 diabetes mellitus with hyperglycemia: Secondary | ICD-10-CM | POA: Diagnosis not present

## 2023-06-21 DIAGNOSIS — E109 Type 1 diabetes mellitus without complications: Secondary | ICD-10-CM | POA: Diagnosis not present

## 2023-06-21 DIAGNOSIS — E1065 Type 1 diabetes mellitus with hyperglycemia: Secondary | ICD-10-CM | POA: Diagnosis not present

## 2023-08-05 DIAGNOSIS — E109 Type 1 diabetes mellitus without complications: Secondary | ICD-10-CM | POA: Diagnosis not present

## 2023-08-05 DIAGNOSIS — E1065 Type 1 diabetes mellitus with hyperglycemia: Secondary | ICD-10-CM | POA: Diagnosis not present

## 2023-08-18 DIAGNOSIS — E785 Hyperlipidemia, unspecified: Secondary | ICD-10-CM | POA: Diagnosis not present

## 2023-08-18 DIAGNOSIS — E039 Hypothyroidism, unspecified: Secondary | ICD-10-CM | POA: Diagnosis not present

## 2023-08-18 DIAGNOSIS — E669 Obesity, unspecified: Secondary | ICD-10-CM | POA: Diagnosis not present

## 2023-08-18 DIAGNOSIS — E1065 Type 1 diabetes mellitus with hyperglycemia: Secondary | ICD-10-CM | POA: Diagnosis not present

## 2023-08-18 DIAGNOSIS — Z4681 Encounter for fitting and adjustment of insulin pump: Secondary | ICD-10-CM | POA: Diagnosis not present

## 2023-09-13 DIAGNOSIS — E1065 Type 1 diabetes mellitus with hyperglycemia: Secondary | ICD-10-CM | POA: Diagnosis not present

## 2023-09-13 DIAGNOSIS — E109 Type 1 diabetes mellitus without complications: Secondary | ICD-10-CM | POA: Diagnosis not present

## 2023-09-21 DIAGNOSIS — E1065 Type 1 diabetes mellitus with hyperglycemia: Secondary | ICD-10-CM | POA: Diagnosis not present

## 2023-09-21 DIAGNOSIS — E109 Type 1 diabetes mellitus without complications: Secondary | ICD-10-CM | POA: Diagnosis not present

## 2023-11-08 DIAGNOSIS — E109 Type 1 diabetes mellitus without complications: Secondary | ICD-10-CM | POA: Diagnosis not present

## 2023-11-08 DIAGNOSIS — E1065 Type 1 diabetes mellitus with hyperglycemia: Secondary | ICD-10-CM | POA: Diagnosis not present

## 2023-12-13 DIAGNOSIS — E109 Type 1 diabetes mellitus without complications: Secondary | ICD-10-CM | POA: Diagnosis not present

## 2023-12-13 DIAGNOSIS — E1065 Type 1 diabetes mellitus with hyperglycemia: Secondary | ICD-10-CM | POA: Diagnosis not present

## 2023-12-21 DIAGNOSIS — E1065 Type 1 diabetes mellitus with hyperglycemia: Secondary | ICD-10-CM | POA: Diagnosis not present

## 2023-12-21 DIAGNOSIS — E109 Type 1 diabetes mellitus without complications: Secondary | ICD-10-CM | POA: Diagnosis not present

## 2023-12-23 DIAGNOSIS — E1065 Type 1 diabetes mellitus with hyperglycemia: Secondary | ICD-10-CM | POA: Diagnosis not present

## 2023-12-23 DIAGNOSIS — E669 Obesity, unspecified: Secondary | ICD-10-CM | POA: Diagnosis not present

## 2023-12-23 DIAGNOSIS — E039 Hypothyroidism, unspecified: Secondary | ICD-10-CM | POA: Diagnosis not present

## 2023-12-23 DIAGNOSIS — E785 Hyperlipidemia, unspecified: Secondary | ICD-10-CM | POA: Diagnosis not present

## 2023-12-23 DIAGNOSIS — Z4681 Encounter for fitting and adjustment of insulin pump: Secondary | ICD-10-CM | POA: Diagnosis not present

## 2024-02-18 DIAGNOSIS — E1065 Type 1 diabetes mellitus with hyperglycemia: Secondary | ICD-10-CM | POA: Diagnosis not present

## 2024-02-18 DIAGNOSIS — E109 Type 1 diabetes mellitus without complications: Secondary | ICD-10-CM | POA: Diagnosis not present

## 2024-03-13 DIAGNOSIS — E109 Type 1 diabetes mellitus without complications: Secondary | ICD-10-CM | POA: Diagnosis not present

## 2024-03-13 DIAGNOSIS — E1065 Type 1 diabetes mellitus with hyperglycemia: Secondary | ICD-10-CM | POA: Diagnosis not present
